# Patient Record
Sex: Female | Born: 1975
Health system: Southern US, Community
[De-identification: ages and names within clinical notes are randomized; demographics above are authoritative.]

## PROBLEM LIST (undated history)

## (undated) DIAGNOSIS — D649 Anemia, unspecified: Secondary | ICD-10-CM

## (undated) DIAGNOSIS — M791 Myalgia, unspecified site: Secondary | ICD-10-CM

## (undated) DIAGNOSIS — E278 Other specified disorders of adrenal gland: Secondary | ICD-10-CM

## (undated) DIAGNOSIS — E669 Obesity, unspecified: Secondary | ICD-10-CM

## (undated) DIAGNOSIS — F410 Panic disorder [episodic paroxysmal anxiety] without agoraphobia: Secondary | ICD-10-CM

## (undated) DIAGNOSIS — IMO0001 Reserved for inherently not codable concepts without codable children: Principal | ICD-10-CM

## (undated) DIAGNOSIS — F329 Major depressive disorder, single episode, unspecified: Secondary | ICD-10-CM

## (undated) DIAGNOSIS — G43909 Migraine, unspecified, not intractable, without status migrainosus: Secondary | ICD-10-CM

## (undated) DIAGNOSIS — F32A Depression, unspecified: Secondary | ICD-10-CM

## (undated) DIAGNOSIS — D3502 Benign neoplasm of left adrenal gland: Secondary | ICD-10-CM

## (undated) DIAGNOSIS — I10 Essential (primary) hypertension: Secondary | ICD-10-CM

## (undated) DIAGNOSIS — M797 Fibromyalgia: Secondary | ICD-10-CM

## (undated) DIAGNOSIS — D509 Iron deficiency anemia, unspecified: Secondary | ICD-10-CM

## (undated) DIAGNOSIS — G932 Benign intracranial hypertension: Secondary | ICD-10-CM

## (undated) DIAGNOSIS — R51 Headache: Secondary | ICD-10-CM

## (undated) HISTORY — DX: Depression, unspecified: F32.A

## (undated) HISTORY — PX: CARPAL TUNNEL RELEASE: SHX101

## (undated) HISTORY — DX: Major depressive disorder, single episode, unspecified: F32.9

## (undated) HISTORY — DX: Iron deficiency anemia, unspecified: D50.9

## (undated) HISTORY — PX: TUBAL LIGATION: SHX77

## (undated) HISTORY — DX: Obesity, unspecified: E66.9

## (undated) HISTORY — DX: Headache: R51

## (undated) HISTORY — DX: Myalgia, unspecified site: M79.10

## (undated) HISTORY — DX: Anemia, unspecified: D64.9

## (undated) HISTORY — DX: Reserved for inherently not codable concepts without codable children: IMO0001

---
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
## 1999-04-28 ENCOUNTER — Emergency Department (HOSPITAL_COMMUNITY): Admission: EM | Admit: 1999-04-28 | Discharge: 1999-04-28 | Payer: Self-pay | Admitting: Emergency Medicine

## 1999-04-28 ENCOUNTER — Encounter: Payer: Self-pay | Admitting: Emergency Medicine

## 1999-05-20 ENCOUNTER — Inpatient Hospital Stay (HOSPITAL_COMMUNITY): Admission: AD | Admit: 1999-05-20 | Discharge: 1999-05-20 | Payer: Self-pay | Admitting: Obstetrics & Gynecology

## 2000-09-06 ENCOUNTER — Other Ambulatory Visit: Admission: RE | Admit: 2000-09-06 | Discharge: 2000-09-06 | Payer: Self-pay | Admitting: Obstetrics and Gynecology

## 2001-07-23 ENCOUNTER — Encounter: Payer: Self-pay | Admitting: Emergency Medicine

## 2001-07-23 ENCOUNTER — Emergency Department (HOSPITAL_COMMUNITY): Admission: EM | Admit: 2001-07-23 | Discharge: 2001-07-23 | Payer: Self-pay | Admitting: Emergency Medicine

## 2001-11-12 ENCOUNTER — Emergency Department (HOSPITAL_COMMUNITY): Admission: EM | Admit: 2001-11-12 | Discharge: 2001-11-12 | Payer: Self-pay | Admitting: *Deleted

## 2001-11-16 ENCOUNTER — Emergency Department (HOSPITAL_COMMUNITY): Admission: EM | Admit: 2001-11-16 | Discharge: 2001-11-16 | Payer: Self-pay | Admitting: Emergency Medicine

## 2001-11-16 ENCOUNTER — Encounter: Payer: Self-pay | Admitting: Emergency Medicine

## 2002-01-04 ENCOUNTER — Emergency Department (HOSPITAL_COMMUNITY): Admission: EM | Admit: 2002-01-04 | Discharge: 2002-01-04 | Payer: Self-pay | Admitting: Internal Medicine

## 2002-02-12 ENCOUNTER — Emergency Department (HOSPITAL_COMMUNITY): Admission: EM | Admit: 2002-02-12 | Discharge: 2002-02-12 | Payer: Self-pay | Admitting: Emergency Medicine

## 2002-03-21 HISTORY — PX: CARPAL TUNNEL RELEASE: SHX101

## 2002-08-26 ENCOUNTER — Emergency Department (HOSPITAL_COMMUNITY): Admission: EM | Admit: 2002-08-26 | Discharge: 2002-08-26 | Payer: Self-pay | Admitting: *Deleted

## 2002-08-26 ENCOUNTER — Encounter: Payer: Self-pay | Admitting: *Deleted

## 2003-06-04 ENCOUNTER — Emergency Department (HOSPITAL_COMMUNITY): Admission: EM | Admit: 2003-06-04 | Discharge: 2003-06-04 | Payer: Self-pay | Admitting: Emergency Medicine

## 2003-10-08 ENCOUNTER — Emergency Department (HOSPITAL_COMMUNITY): Admission: EM | Admit: 2003-10-08 | Discharge: 2003-10-08 | Payer: Self-pay | Admitting: Emergency Medicine

## 2004-01-02 ENCOUNTER — Emergency Department (HOSPITAL_COMMUNITY): Admission: EM | Admit: 2004-01-02 | Discharge: 2004-01-02 | Payer: Self-pay | Admitting: Emergency Medicine

## 2004-03-21 HISTORY — PX: TUBAL LIGATION: SHX77

## 2004-04-16 ENCOUNTER — Emergency Department (HOSPITAL_COMMUNITY): Admission: EM | Admit: 2004-04-16 | Discharge: 2004-04-16 | Payer: Self-pay | Admitting: Emergency Medicine

## 2004-06-07 ENCOUNTER — Emergency Department (HOSPITAL_COMMUNITY): Admission: EM | Admit: 2004-06-07 | Discharge: 2004-06-07 | Payer: Self-pay | Admitting: Emergency Medicine

## 2004-10-01 ENCOUNTER — Ambulatory Visit (HOSPITAL_COMMUNITY): Admission: AD | Admit: 2004-10-01 | Discharge: 2004-10-01 | Payer: Self-pay | Admitting: Obstetrics and Gynecology

## 2004-10-12 ENCOUNTER — Ambulatory Visit (HOSPITAL_COMMUNITY): Admission: RE | Admit: 2004-10-12 | Discharge: 2004-10-12 | Payer: Self-pay | Admitting: Obstetrics and Gynecology

## 2004-10-24 ENCOUNTER — Ambulatory Visit (HOSPITAL_COMMUNITY): Admission: AD | Admit: 2004-10-24 | Discharge: 2004-10-24 | Payer: Self-pay | Admitting: Obstetrics and Gynecology

## 2004-11-12 ENCOUNTER — Ambulatory Visit (HOSPITAL_COMMUNITY): Admission: RE | Admit: 2004-11-12 | Discharge: 2004-11-12 | Payer: Self-pay | Admitting: Obstetrics and Gynecology

## 2004-11-23 ENCOUNTER — Ambulatory Visit (HOSPITAL_COMMUNITY): Admission: AD | Admit: 2004-11-23 | Discharge: 2004-11-23 | Payer: Self-pay | Admitting: Obstetrics and Gynecology

## 2004-11-24 ENCOUNTER — Inpatient Hospital Stay (HOSPITAL_COMMUNITY): Admission: RE | Admit: 2004-11-24 | Discharge: 2004-11-27 | Payer: Self-pay | Admitting: Obstetrics and Gynecology

## 2004-11-24 ENCOUNTER — Encounter: Payer: Self-pay | Admitting: Obstetrics and Gynecology

## 2005-04-12 ENCOUNTER — Emergency Department (HOSPITAL_COMMUNITY): Admission: EM | Admit: 2005-04-12 | Discharge: 2005-04-12 | Payer: Self-pay | Admitting: Emergency Medicine

## 2005-08-30 ENCOUNTER — Ambulatory Visit (HOSPITAL_COMMUNITY): Admission: RE | Admit: 2005-08-30 | Discharge: 2005-08-30 | Payer: Self-pay | Admitting: Orthopaedic Surgery

## 2005-08-30 ENCOUNTER — Encounter (INDEPENDENT_AMBULATORY_CARE_PROVIDER_SITE_OTHER): Payer: Self-pay | Admitting: *Deleted

## 2006-04-24 ENCOUNTER — Emergency Department (HOSPITAL_COMMUNITY): Admission: EM | Admit: 2006-04-24 | Discharge: 2006-04-24 | Payer: Self-pay | Admitting: Emergency Medicine

## 2009-03-01 ENCOUNTER — Emergency Department (HOSPITAL_COMMUNITY): Admission: EM | Admit: 2009-03-01 | Discharge: 2009-03-01 | Payer: Self-pay | Admitting: Emergency Medicine

## 2009-06-04 ENCOUNTER — Emergency Department (HOSPITAL_COMMUNITY): Admission: EM | Admit: 2009-06-04 | Discharge: 2009-06-04 | Payer: Self-pay | Admitting: Emergency Medicine

## 2009-09-30 ENCOUNTER — Emergency Department (HOSPITAL_COMMUNITY): Admission: EM | Admit: 2009-09-30 | Discharge: 2009-09-30 | Payer: Self-pay | Admitting: Emergency Medicine

## 2009-11-26 ENCOUNTER — Emergency Department (HOSPITAL_COMMUNITY): Admission: EM | Admit: 2009-11-26 | Discharge: 2009-11-26 | Payer: Self-pay | Admitting: Family Medicine

## 2010-04-09 ENCOUNTER — Emergency Department (HOSPITAL_COMMUNITY)
Admission: EM | Admit: 2010-04-09 | Discharge: 2010-04-09 | Payer: Self-pay | Source: Home / Self Care | Admitting: Emergency Medicine

## 2010-06-22 LAB — RAPID STREP SCREEN (MED CTR MEBANE ONLY): Streptococcus, Group A Screen (Direct): NEGATIVE

## 2010-08-06 NOTE — Group Therapy Note (Signed)
Julie Schwartz, Julie Schwartz             ACCOUNT NO.:  [id number]   MEDICAL RECORD NO.:  [id number]          PATIENT TYPE:  OIB   LOCATION:  A415                          FACILITY:  [facility]   PHYSICIAN:  Tilda Burrow, M.D. DATE OF BIRTH:  December 13, 1975   DATE OF PROCEDURE:  DATE OF DISCHARGE:                                   PROGRESS NOTE   Briellah is 36-5/[redacted] weeks gestation. Planned repeat Cesarean section. Called  the office complaining of some nausea and cramping. We sent her over here to  rule out labor. She was having a few mild contractions. Her cervix was long,  thick, and closed. Her nausea subsided. The fetal heart rate is reactive, so  she was sent home in stable condition after about two hours of observation.      Jacklyn Shell, C.N.M.      Tilda Burrow, M.D.  Electronically Signed    FC/MEDQ  D:  11/12/2004  T:  11/12/2004  Job:  [id number]

## 2010-08-06 NOTE — Group Therapy Note (Signed)
NAMENAKKIA, MACKIEWICZ             ACCOUNT NO.:  [id number]   MEDICAL RECORD NO.:  [id number]          PATIENT TYPE:  OIB   LOCATION:  LDR1                          FACILITY:  [facility]   PHYSICIAN:  Lazaro Arms, M.D.   DATE OF BIRTH:  [birth year]   DATE OF PROCEDURE:  11/23/2004  DATE OF DISCHARGE:  11/23/2004                                   PROGRESS NOTE   HISTORY OF PRESENT ILLNESS:  Julie Schwartz was down in registration doing her preop  for her scheduled repeat cesarean section tomorrow afternoon. Started having  a little shortness of breath, hunger pains, and some tightening of her  stomach. She was evaluated. Noted to be having an occasional mild  contraction. O2 saturations were 98%. She felt better after sitting down and  resting for a while. Feels like she probably just got too hot and a little  hungry. Her cervix is tight, fingertip, really log. Vertex is minus 2 to  minus 3.   DISPOSITION:  So she was sent home in stable condition and will return as  scheduled tomorrow for her cesarean section.      Jacklyn Shell, C.N.M.      Lazaro Arms, M.D.  Electronically Signed    FC/MEDQ  D:  11/23/2004  T:  11/23/2004  Job:  [id number]   cc:   Jacklyn Shell, C.N.M.  [address]  [place], [place] [address]  Fax: [phone]

## 2010-08-06 NOTE — H&P (Signed)
NAMETREANNA, Julie Schwartz NO.:  [id number]   MEDICAL RECORD NO.:  [id number]           PATIENT TYPE:   LOCATION:                                FACILITY:  [facility]   PHYSICIAN:  Tilda Burrow, M.D.      DATE OF BIRTH:   DATE OF ADMISSION:  11/24/2004  DATE OF DISCHARGE:  LH                                HISTORY & PHYSICAL   ADMISSION DIAGNOSES:  1.  Pregnancy 38-1/[redacted] weeks gestation.  2.  Repair cesarean section.  Offered trial of labor.  3.  Desire for elective sterilization.  4.  Chronic hypertension, well controlled.   HISTORY OF PRESENT ILLNESS:  This 35 year old female gravida 2, para 1, AB  0, LMP February 02, 2004, placing menstrual EDC at November 08, 2004 with  ultrasound correction of [facility] of December 05, 2004 based on six-week  ultrasound and December 01, 2004 based on 22-week ultrasound, is admitted  after a pregnancy course followed through out office. She will be  approximately 38-1/[redacted] weeks gestation by ultrasound criteria on the date of  scheduled cesarean section, November 24, 2004 at 12:30 p.m.   Prenatal course has been notable through the office for well-controlled  blood pressure through 20 prenatal visits. She has been on Aldomet 500 mg  twice a day which was cut down to 250 mg q.a.m., 250 q.p.m. and 500 mg at  night with continued dizziness.  She was switched to Norvasc 30 mg p.o.  daily and has done beautifully with blood pressure in the 112-140/60-80  range.  Fundal height growth has been appropriate.  Her first cesarean  section was in 2001 for cephalopelvic disproportion after a pregnancy  complicated by pregnancy-induced hypertension.   PAST MEDICAL HISTORY:  1.  Pregnancy-induced hypertension with first pregnancy.  2.  Cesarean section, low transverse in 2001.   ALLERGIES:  None.   SOCIAL HISTORY:  Single. Lives with mother.  Has no desire for future child-  bearing.  Understands the technical aspects of sterilization and with  failure rate of 1 in 100 quoted the patient.   PRENATAL LABS:  Blood type A positive.  Urine drug screen negative.  Hemoglobin 11, hematocrit 34, rubella immune.  present__.  Hepatitis, HIV,  RPR, GC and Chlamydia all negative.  MSAFP negative at 1 in 650 [place]  syndrome risk.  Glucose tolerance test 90 mg/% at 28 weeks.  This is the  first baby of her current partner, Celestia Khat.   PLAN:  Repeat cesarean section and tubal ligation Wednesday, September 6 at  12:30 p.m.      Tilda Burrow, M.D.  Electronically Signed     JVF/MEDQ  D:  11/17/2004  T:  11/17/2004  Job:  [id number]   cc:   Mila Homer. Sudie Bailey, M.D.  [address] [place], [place] [address]  Fax: [phone]   Francoise Schaumann. Halford Chessman  Fax: [phone]

## 2010-08-06 NOTE — Op Note (Signed)
[name], Julie Schwartz             ACCOUNT NO.:  [id number]   MEDICAL RECORD NO.:  [id number]          PATIENT TYPE:  INP   LOCATION:  A402                          FACILITY:  [facility]   PHYSICIAN:  Tilda Burrow, M.D. DATE OF BIRTH:  [birth year]   DATE OF PROCEDURE:  11/24/2004  DATE OF DISCHARGE:                                 OPERATIVE REPORT   PREOPERATIVE DIAGNOSES:  1.  Pregnancy, 38-1/[redacted] weeks gestation, prior Cesarean section, not for trial      of labor.  2.  Elective permanent sterilization.  3.  Chronic hypertension, stable.   POSTOPERATIVE DIAGNOSES:  1.  Pregnancy, 38-1/[redacted] weeks gestation, prior Cesarean section, not for trial      of labor.  2.  Elective permanent sterilization.  3.  Chronic hypertension, stable.   PROCEDURE:  Repeat low transverse cervical Cesarean section. Bilateral  partial salpingectomy, excision of cicatrix (__NC___).   SURGEON:  Tilda Burrow, M.D.   ASSISTANTGeralynn Ochs, C.S.T.-F.A.   ANESTHESIA:  Spinal, Idacavage, C.R.N.A.   COMPLICATIONS:  None.   FINDINGS:  Healthy female infant. Apgars 9 and 9. Weight __________.   INDICATIONS:  A 35 year old female followed through the pregnancy with a  known prior low transverse cervical Cesarean section.   DETAILS OF PROCEDURE:  Spinal anesthesia was introduced by Mr. Idacavage,  C.R.N.A. Abdomen prepped and draped, and the transverse lower abdominal  incision performed in a standard fashion. The cicatrix was discarded. The  peritoneal cavity was entered in standard Pfannenstiel technique, and some  small amount of omental adhesions to the bladder flap area were taken down.  We then proceeded with development of bladder flap on the lower uterine  segment and transverse lower uterine segment incision made with knife down  to the amniotic sac, extended laterally using index finger traction. Fetal  vertex was delivered using vacuum extractor guidance combined with fundal  pressure. The umbilical  cord was found to be extremely short, less than 30  cm total length. The cord was clamped, cut, and the infant was passed to the  waiting Dr. Francoise Schaumann. Halm, D.O.   Cord blood samples were then obtained. Placenta delivered by Crede uterine  massage and membranes apparently intact. The uterus was found to be  hemostatic and closed using single layer of running locking closure of 2-0  chromic with some antibiotic-containing solution instilled into the uterus.  Bladder flap was reapproximated with interrupted 2-0 chromic and then the  abdomen emptied of some residual clots. Peritoneum was irrigated so that the  clots were completely removed. We then proceeded with tubal ligation.   Tubal ligation consisted of doubly ligating around the mid segment knuckle  of each tube, excising the incarcerated segment of tube and sending these  portions of resected tube for histologic confirmation.   The anterior peritoneum was closed with continuous running 2-0 chromic, the  fascia closed with continuous running 0 Vicryl. The subcutaneous fatty  tissues trimmed so that they would leave a more satisfactory cosmetic result  and then pulled together using interrupted 2-0 plain sutures x2. Staple  closure of the skin  followed with patient tolerating the whole procedure  well and going to the recovery room in stable condition with sponge and  needle counts correction.      Tilda Burrow, M.D.  Electronically Signed     JVF/MEDQ  D:  11/24/2004  T:  11/24/2004  Job:  [id number]   cc:   Francoise Schaumann. Halford Chessman  Fax: [phone]

## 2010-08-06 NOTE — Consult Note (Signed)
Julie Schwartz, Julie Schwartz             ACCOUNT NO.:  [id number]   MEDICAL RECORD NO.:  [id number]          PATIENT TYPE:  OIB   LOCATION:  LDR3                          FACILITY:  [facility]   PHYSICIAN:  Tilda Burrow, M.D. DATE OF BIRTH:  01/13/1976   DATE OF CONSULTATION:  10/12/2004  DATE OF DISCHARGE:                                   CONSULTATION   REASON FOR ADMISSION:  Pregnancy at 32 weeks with preterm uterine  contractions.   HISTORY OF PRESENT ILLNESS:  Julie Schwartz was up most of the evening with preterm  contractions.  When they got more uncomfortable, she proceeded to the  hospital.  Upon arrival to the hospital, they were about 2-4 minutes apart  and very mild.  Cervix was a fingertip deep, posterior and high.  With exam  this morning, exam is the same.  There are no uterine contractions at  present time.  After hydration and _tocolysis with___ terbutaline.  Labs  were within normal limits.   PLAN:  We are going to discharge her home.  She will follow up with me in  the morning in the office for fetal fibronectin.  She knows to call with any  problems.       DL/MEDQ  D:  16/12/9602  T:  10/12/2004  Job:  [id number]

## 2010-08-06 NOTE — Consult Note (Signed)
NAMENIEVES, CHAPA NO.:  [id number]   MEDICAL RECORD NO.:  [id number]          PATIENT TYPE:  EMS   LOCATION:  ED                            FACILITY:  [facility]   PHYSICIAN:  Tilda Burrow, M.D. DATE OF BIRTH:  09/17/1975   DATE OF CONSULTATION:  04/16/2004  DATE OF DISCHARGE:                                   CONSULTATION   CHIEF COMPLAINT:  Right lower quadrant pain, early pregnancy.   HISTORY OF PRESENT ILLNESS:  The patient is a 35 year old female, gravida 1,  para 1 seen in the emergency department for evaluation of acute onset of  right lower quadrant pain beginning approximately 4 o'clock a.m. on April 16, 2004, presenting to the emergency room at 6 o'clock a.m. with right  lower quadrant pain, no nausea or vomiting.  The pain began spontaneously,  awoke the patient.  This is not associated with bleeding.  The patient has  noticed slight increase in vaginal secretions only.  The pregnancy was  confirmed in our office with positive pregnancy test last week and  ultrasound suggested fetal heart motion on a very early ultrasound.  The  patient has been evaluated with normal white count of 6000, is afebrile with  patient undergoing vaginal ultrasound.   PHYSICAL EXAMINATION:  GENERAL:  A generally healthy female in minimal  discomfort at the time of evaluation.   Ultrasound scan is observed which shows a normal cervix, anteflexed uterus,  intrauterine gestational sac with yolk sac visible and tiny 2 to 3 mm fetal  pole with fetal heart motion noted, consistent with intrauterine pregnancy  at six to seven weeks gestation.  Adnexa showed a right adnexal ovarian cyst  measuring 2.7 cm maximum diameter, it is a simple cyst.  The patient is not  acutely tender when vaginal probing is in the area.  Obviously, the pain has  diminished from presentation.  There is not significant adnexal fluid.   IMPRESSION:  Corpus luteum cyst in early pregnancy.  No evidence  of adnexal  torsion.   PLAN:  Tylenol #3 for pain, routine follow-up in our office.  Signs and  symptoms of adnexal torsion reviewed with the patient.      JVF/MEDQ  D:  04/16/2004  T:  04/16/2004  Job:  [id number]

## 2010-08-06 NOTE — Discharge Summary (Signed)
Julie Schwartz, Julie Schwartz             ACCOUNT NO.:  [id number]   MEDICAL RECORD NO.:  [id number]          PATIENT TYPE:  INP   LOCATION:  A402                          FACILITY:  [facility]   PHYSICIAN:  Tilda Burrow, M.D. DATE OF BIRTH:  [birth year]   DATE OF ADMISSION:  11/24/2004  DATE OF DISCHARGE:  09/09/2006LH                                 DISCHARGE SUMMARY   ADMISSION DIAGNOSES:  1.  Pregnancy 38-1/[redacted] weeks gestation, repeat cesarean section, not for trial      of labor.  2.  Desire for elective sterilization.  3.  Chronic hypertension well controlled.   DISCHARGE DIAGNOSES:  1.  Pregnancy 38-1/[redacted] weeks gestation, repeat cesarean section, not for trial      of labor.  2.  Desire for elective sterilization.  3.  Chronic hypertension well controlled.   PROCEDURES:  Repeat low transverse cervical cesarean section, bilateral  partial salpingectomy, excision of cicatrix ([place]) performed November 24, 2004.   DISCHARGE MEDICATIONS:  Tylox 1 p.o. q.4 h p.r.n. pain.   HOSPITAL COURSE:  The patient was admitted third day surgery and underwent  repeat low transverse cervical cesarean section, bilateral partial  salpingectomy with excision of a large cicatrix as described in the  operative note of November 24, 2004.  The umbilical cord was extremely  short, less than 30 cm total length, but the baby was not normal in  appearance with three vessel cord confirmed.  EBL at surgery was within  normal limits.  Estimated at 600 cc.  A portion of each tube was removed at  surgery confirming permanent tubal sterilization.  The patient then had  postoperative course that was uneventful.  She had routine postpartum care  x2 days, stable for discharge, to hotel status on postoperative day #3 due  to the baby feeding poorly and having tachypnea.  This required an  additional day of hospital status.  She was converted to hotel status on  September 9, and stayed in that for 24 hours after discharge  from the  hospital.      Tilda Burrow, M.D.  Electronically Signed     JVF/MEDQ  D:  12/09/2004  T:  12/10/2004  Job:  [id number]

## 2010-08-06 NOTE — Discharge Summary (Signed)
NAMEZOEE, HEENEY             ACCOUNT NO.:  [id number]   MEDICAL RECORD NO.:  [id number]          PATIENT TYPE:  OIB   LOCATION:  LDR1                          FACILITY:  [facility]   PHYSICIAN:  Tilda Burrow, M.D. DATE OF BIRTH:  Nov 15, 1975   DATE OF ADMISSION:  10/01/2004  DATE OF DISCHARGE:  07/14/2006LH                                 DISCHARGE SUMMARY   CHIEF COMPLAINT:  Shooting vaginal pain x2 days.   OBSERVATION COURSE:  This is a 35 year old female at 71 weeks' gestation who  presents with sharp, shooting pains different from prior pregnancy.  No  bleeding noted or gush of fluid and no leakage.  No recent sexual  intercourse.  Pain has been going on x2 days.  External monitoring shows the  fetus to be healthy and reactive with no decelerations.   PHYSICAL EXAMINATION:  ABDOMEN:  Abdomen is nontender.  Uterus is  appropriate size for 30 weeks' gestation.  PELVIC:  Cervical exam with pelvis well-supported.  The uterus with internal  os closed and cervix quite long with no bleeding.   IMPRESSION:  Musculoskeletal  of pregnancy (lightening), pregnancy 30 weeks.   PLAN:  1.  Analgesics encouraged.  2.  Light activity x48 hours.  3.  Prescription for Tylox, 30 tablets, one q.6h. p.r.n. pain.   FOLLOW UP:  Follow up as scheduled in one week or earlier if change in  status.       JVF/MEDQ  D:  10/01/2004  T:  10/01/2004  Job:  [id number]

## 2010-08-06 NOTE — Op Note (Signed)
NAMEJEMIAH, Julie Schwartz             ACCOUNT NO.:  [id number]   MEDICAL RECORD NO.:  [id number]          PATIENT TYPE:  AMB   LOCATION:  DAY                           FACILITY:  [facility]   PHYSICIAN:  J. Darreld Mclean, M.D. DATE OF BIRTH:  09-28-1975   DATE OF PROCEDURE:  08/30/2005  DATE OF DISCHARGE:                                 OPERATIVE REPORT   PREOPERATIVE DIAGNOSIS:  Carpal tunnel syndrome, right.   POSTOPERATIVE DIAGNOSIS:  Carpal tunnel syndrome, right.   PROCEDURE:  Open release of volar carpal ligaments, saline neurolysis,  epineurotomy the median nerve.   ANESTHESIA:  Bier block. Please refer to anesthesia for Bier block  tourniquet time.   No drains used, volar plaster splint applied.   SURGEON:  J. Darreld Mclean, M.D.   INDICATIONS FOR PROCEDURE:  The patient is a 35 year old female with  positive EMGs and neuroconduction studies showing carpal tunnel syndrome on  the right. She has not improved by conservative treatment. Surgery is  recommended. The risks and imponderables were discussed with the patient  preoperatively. She appears to understand and agreed with the procedure as  outlined.   DESCRIPTION OF PROCEDURE:  She was seen in the holding area and the right  hand was identified as the correct surgical site. I placed a mark on the  right wrist. She was brought to the operating room. She was placed supine  and given Bier block anesthesia to her right upper extremity. Had a timeout  re-identifying the patient, doing the right arm and right wrist for carpal  tunnel syndrome. Prepped and draped in the usual manner, __________ incision  was made with careful dissection. The median nerve was identified  proximally, a Vesseloop placed around the nerve. A groove director placed  within the carpal tunnel space. The volar carpal ligament was then incised.  A specimen of volar carpal ligament was sent to pathology. Retinaculum cut  proximally. A saline neurolysis and  separate neurotomy carried out of the  nerve. No apparent injury of the nerve was present. The wound was then  reapproximated using 3-0 nylon in interrupted vertical mattress manner. A  sterile dressing applied, bulky dressing applied, sheet cotton applied.  Sheet cotton cut dorsally. Volar plaster splint applied, each bandage  applied loosely. The patient tolerated the procedure well and went to  recovery in good condition. A prescription of Vicodin ES given for pain. I  will see him in the office in approximately 10 days to 2 weeks. If any  difficulties, she is to contact me through the office or hospital beeper  system, numbers have been provided.          ______________________________  [name], M.D.    JWK/MEDQ  D:  08/30/2005  T:  08/30/2005  Job:  [id number]

## 2010-08-21 ENCOUNTER — Inpatient Hospital Stay (INDEPENDENT_AMBULATORY_CARE_PROVIDER_SITE_OTHER)
Admission: RE | Admit: 2010-08-21 | Discharge: 2010-08-21 | Disposition: A | Discharge status: Home / Self Care | Payer: 59 | Source: Ambulatory Visit | Attending: Family Medicine | Admitting: Family Medicine

## 2010-08-21 DIAGNOSIS — T22029A Burn of unspecified degree of unspecified elbow, initial encounter: Secondary | ICD-10-CM

## 2010-09-23 ENCOUNTER — Emergency Department (HOSPITAL_COMMUNITY)
Admission: EM | Admit: 2010-09-23 | Discharge: 2010-09-23 | Disposition: A | Discharge status: Home / Self Care | Payer: 59 | Attending: Emergency Medicine | Admitting: Emergency Medicine

## 2010-09-23 DIAGNOSIS — I10 Essential (primary) hypertension: Secondary | ICD-10-CM | POA: Insufficient documentation

## 2010-09-23 DIAGNOSIS — K089 Disorder of teeth and supporting structures, unspecified: Secondary | ICD-10-CM | POA: Insufficient documentation

## 2011-01-23 ENCOUNTER — Emergency Department (HOSPITAL_COMMUNITY)
Admission: EM | Admit: 2011-01-23 | Discharge: 2011-01-23 | Disposition: A | Discharge status: Home / Self Care | Payer: 59 | Attending: Emergency Medicine | Admitting: Emergency Medicine

## 2011-01-23 ENCOUNTER — Encounter: Payer: Self-pay | Admitting: Emergency Medicine

## 2011-01-23 DIAGNOSIS — K047 Periapical abscess without sinus: Secondary | ICD-10-CM | POA: Insufficient documentation

## 2011-01-23 DIAGNOSIS — K0381 Cracked tooth: Secondary | ICD-10-CM | POA: Insufficient documentation

## 2011-01-23 MED ORDER — IBUPROFEN 800 MG PO TABS: 800.0000 mg | ORAL_TABLET | Freq: Three times a day (TID) | ORAL | Status: AC | PRN

## 2011-01-23 MED ORDER — HYDROCODONE-ACETAMINOPHEN 5-325 MG PO TABS: 1.0000 | ORAL_TABLET | ORAL | Status: AC | PRN

## 2011-01-23 MED ORDER — AMOXICILLIN 250 MG PO CAPS
500.0000 mg | ORAL_CAPSULE | Freq: Once | ORAL | Status: AC
  Administered 2011-01-23: 500 mg via ORAL
  Filled 2011-01-23: qty 2

## 2011-01-23 MED ORDER — AMOXICILLIN 500 MG PO CAPS: 500.0000 mg | ORAL_CAPSULE | Freq: Three times a day (TID) | ORAL | Status: AC

## 2011-01-23 MED ORDER — IBUPROFEN 800 MG PO TABS
800.0000 mg | ORAL_TABLET | Freq: Once | ORAL | Status: AC
  Administered 2011-01-23: 800 mg via ORAL
  Filled 2011-01-23: qty 1

## 2011-01-23 NOTE — ED Notes (Addendum)
Patient c/o upper, right sided dental pain that started yesterday. Swelling noted to right side of jaw

## 2011-01-23 NOTE — ED Provider Notes (Signed)
Medical screening examination/treatment/procedure(s) were performed by non-physician practitioner and as supervising physician I was immediately available for consultation/collaboration.   Benny Lennert, MD 01/23/11 1504

## 2011-01-23 NOTE — ED Provider Notes (Signed)
History     CSN: [id number] Arrival date & time: 01/23/2011  8:04 AM   First MD Initiated Contact with Patient 01/23/11 [phone]      Chief Complaint  Patient presents with  . Dental Pain    (Consider location/radiation/quality/duration/timing/severity/associated sxs/prior treatment) Patient is a 35 y.o. female presenting with tooth pain. The history is provided by the patient.  Dental PainThe primary symptoms include mouth pain and dental injury. Primary symptoms do not include headaches, fever, shortness of breath or sore throat. The symptoms began 12 to 24 hours ago. The symptoms are worsening (She took tylenol with no relief of symptoms.  She has found no alleviators.). The symptoms occur constantly.  Mouth pain occurs constantly. Mouth pain is worsening. Affected locations include: teeth and gum(s). At its highest the mouth pain was at 8/10. The mouth pain is currently at 8/10.  Additional symptoms include: dental sensitivity to temperature, gum swelling, gum tenderness, jaw pain and facial swelling. Additional symptoms do not include: trouble swallowing and swollen glands.    History reviewed. No pertinent past medical history.  Past Surgical History  Procedure Date  . Cesarean section     No family history on file.  History  Substance Use Topics  . Smoking status: Never Smoker   . Smokeless tobacco: Not on file  . Alcohol Use: Yes     occasionally    OB History    Grav Para Term Preterm Abortions TAB SAB Ect Mult Living                  Review of Systems  Constitutional: Negative for fever.  HENT: Positive for facial swelling and dental problem. Negative for congestion, sore throat, trouble swallowing and neck pain.   Eyes: Negative.   Respiratory: Negative for chest tightness and shortness of breath.   Cardiovascular: Negative for chest pain.  Gastrointestinal: Negative for nausea and abdominal pain.  Genitourinary: Negative.   Musculoskeletal: Negative for  joint swelling and arthralgias.  Skin: Negative.  Negative for rash and wound.  Neurological: Negative for dizziness, weakness, light-headedness, numbness and headaches.  Hematological: Negative.   Psychiatric/Behavioral: Negative.     Allergies  Review of patient's allergies indicates no known allergies.  Home Medications  No current outpatient prescriptions on file.  BP 133/79  Pulse 72  Temp(Src) 98.2 F (36.8 C) (Oral)  Resp 15  Ht 5\' 9"  (1.753 m)  Wt 186 lb (84.369 kg)  BMI 27.47 kg/m2  SpO2 96%  LMP 12/31/2010  Physical Exam  Constitutional: She is oriented to person, place, and time. She appears well-developed and well-nourished. No distress.  HENT:  Head: Normocephalic and atraumatic.  Right Ear: Tympanic membrane and external ear normal.  Left Ear: Tympanic membrane and external ear normal.  Mouth/Throat: Oropharynx is clear and moist and mucous membranes are normal. No oral lesions. Dental abscesses present.    Eyes: Conjunctivae are normal.  Neck: Normal range of motion. Neck supple.  Cardiovascular: Normal rate and normal heart sounds.   Pulmonary/Chest: Effort normal.  Abdominal: She exhibits no distension.  Musculoskeletal: Normal range of motion.  Lymphadenopathy:    She has no cervical adenopathy.  Neurological: She is alert and oriented to person, place, and time.  Skin: Skin is warm and dry. No erythema.  Psychiatric: She has a normal mood and affect.    ED Course  Procedures (including critical care time)  Labs Reviewed - No data to display No results found.   No diagnosis found.  MDM  Dental fracture with early abscess.  Amoxil,  Hydrocodone,  Ibuprofen.  Patient has dentist in [place] she plans to call in am.       Candis Musa, [place] 01/23/11 [phone]

## 2011-03-19 ENCOUNTER — Encounter (HOSPITAL_COMMUNITY): Payer: Self-pay | Admitting: Emergency Medicine

## 2011-03-19 ENCOUNTER — Emergency Department (HOSPITAL_COMMUNITY)
Admission: EM | Admit: 2011-03-19 | Discharge: 2011-03-19 | Disposition: A | Discharge status: Home / Self Care | Payer: 59 | Attending: Emergency Medicine | Admitting: Emergency Medicine

## 2011-03-19 DIAGNOSIS — K029 Dental caries, unspecified: Secondary | ICD-10-CM

## 2011-03-19 DIAGNOSIS — K089 Disorder of teeth and supporting structures, unspecified: Secondary | ICD-10-CM | POA: Insufficient documentation

## 2011-03-19 MED ORDER — PENICILLIN V POTASSIUM 500 MG PO TABS: 500.0000 mg | ORAL_TABLET | Freq: Four times a day (QID) | ORAL | Status: AC

## 2011-03-19 MED ORDER — IBUPROFEN 600 MG PO TABS: 600.0000 mg | ORAL_TABLET | Freq: Four times a day (QID) | ORAL | Status: AC | PRN

## 2011-03-19 MED ORDER — HYDROCODONE-ACETAMINOPHEN 5-325 MG PO TABS: 2.0000 | ORAL_TABLET | ORAL | Status: AC | PRN

## 2011-03-19 NOTE — ED Provider Notes (Signed)
History     CSN: [id number]  Arrival date & time 03/19/11  0545   First MD Initiated Contact with Patient 03/19/11 [phone]      Chief Complaint  Patient presents with  . Dental Pain    (Consider location/radiation/quality/duration/timing/severity/associated sxs/prior treatment) HPI Comments: Patient reports that she began having pain 2 days ago in her right upper tooth.  She has a Education officer, community, but is not able to get in to see him at this time.  No injury to the tooth.  She reports that the pain is getting progressively worse.    Patient is a 35 y.o. female presenting with tooth pain. The history is provided by the patient.  Dental PainPrimary symptoms do not include oral bleeding, oral lesions, headaches, fever, shortness of breath or sore throat.    History reviewed. No pertinent past medical history.  Past Surgical History  Procedure Date  . Cesarean section     History reviewed. No pertinent family history.  History  Substance Use Topics  . Smoking status: Never Smoker   . Smokeless tobacco: Not on file  . Alcohol Use: Yes     occasionally    OB History    Grav Para Term Preterm Abortions TAB SAB Ect Mult Living                  Review of Systems  Constitutional: Negative for fever and chills.  HENT: Negative for sore throat, neck pain and neck stiffness.   Respiratory: Negative for shortness of breath.   Neurological: Negative for dizziness, syncope, light-headedness and headaches.    Allergies  Review of patient's allergies indicates no known allergies.  Home Medications   Current Outpatient Rx  Name Route Sig Dispense Refill  . ACETAMINOPHEN 500 MG PO TABS Oral Take 1,000 mg by mouth every 6 (six) hours as needed. For pain       BP 155/95  Pulse 100  Temp(Src) 99.1 F (37.3 C) (Oral)  Resp 18  SpO2 98%  LMP 03/07/2011  Physical Exam  Nursing note and vitals reviewed. Constitutional: She is oriented to person, place, and time. She appears  well-developed and well-nourished. No distress.  HENT:  Head: Normocephalic and atraumatic. No trismus in the jaw.  Mouth/Throat: Uvula is midline, oropharynx is clear and moist and mucous membranes are normal. No oral lesions. Dental caries present. No dental abscesses or uvula swelling.       No abscess visualized.  Dental decay of the right upper molar tooth  Neck: Normal range of motion. Neck supple.  Cardiovascular: Normal rate, regular rhythm and normal heart sounds.   Pulmonary/Chest: Effort normal and breath sounds normal. No respiratory distress.  Neurological: She is alert and oriented to person, place, and time.  Skin: Skin is warm and dry. No rash noted. She is not diaphoretic.  Psychiatric: She has a normal mood and affect.    ED Course  Procedures (including critical care time)  Labs Reviewed - No data to display No results found.   1. Dental decay       MDM  No acute injury or fracture to the tooth.  Dental decay present.  No trismus, fever, or decreased ROM of neck.  Feel that pain is most likely secondary to dental decay.  Patient given Rx for PCN and pain medication.  Patient instructed to follow up with her dentist.        Pascal Lux [facility] 03/19/11 [phone]

## 2011-03-19 NOTE — ED Notes (Signed)
PT. REPORTS PROGRESSING RIGHT UPPER MOLAR PAIN FOR 2 DAYS WITH SWELLING .

## 2011-03-20 NOTE — ED Provider Notes (Signed)
Medical screening examination/treatment/procedure(s) were performed by non-physician practitioner and as supervising physician I was immediately available for consultation/collaboration.  Jasmine Awe, MD 03/20/11 [phone]

## 2012-04-28 ENCOUNTER — Encounter (HOSPITAL_COMMUNITY): Payer: Self-pay

## 2012-04-28 DIAGNOSIS — R51 Headache: Secondary | ICD-10-CM | POA: Insufficient documentation

## 2012-04-28 DIAGNOSIS — R42 Dizziness and giddiness: Secondary | ICD-10-CM | POA: Insufficient documentation

## 2012-04-28 DIAGNOSIS — H53149 Visual discomfort, unspecified: Secondary | ICD-10-CM | POA: Insufficient documentation

## 2012-04-28 NOTE — ED Notes (Signed)
Patient presents with right sided headache that has progressively worsened over the past 3-4 days. No hx of the same. Describes pain as a "pressure" with some dizziness, photophobia and phonophobia. Denies N/V. LMP 04/23/12.

## 2012-04-29 ENCOUNTER — Emergency Department (HOSPITAL_COMMUNITY)
Admission: EM | Admit: 2012-04-29 | Discharge: 2012-04-29 | Disposition: A | Discharge status: Home / Self Care | Payer: 59 | Attending: Emergency Medicine | Admitting: Emergency Medicine

## 2012-04-29 ENCOUNTER — Emergency Department (HOSPITAL_COMMUNITY): Payer: 59

## 2012-04-29 DIAGNOSIS — R51 Headache: Secondary | ICD-10-CM

## 2012-04-29 NOTE — ED Provider Notes (Signed)
History     CSN: [id number]  Arrival date & time 04/28/12  2232   First MD Initiated Contact with Patient 04/29/12 [phone]      Chief Complaint  Patient presents with  . Headache    (Consider location/radiation/quality/duration/timing/severity/associated sxs/prior treatment) HPI Comments: GENITA NILSSON is a 37 y.o. Female who complains a headache for 3 days. The headache waxes and wanes. She has a sensation of pressure around her right eye with photophobia, and dizziness. She denies sinus congestion, or drainage. There's been no fever, chills, nausea, or vomiting. She has not tried any medications for the problem. There are no known modifying factors. She's never had a problem like this before.  Patient is a 37 y.o. female presenting with headaches. The history is provided by the patient.  Headache   History reviewed. No pertinent past medical history.  Past Surgical History  Procedure Laterality Date  . Cesarean section      No family history on file.  History  Substance Use Topics  . Smoking status: Never Smoker   . Smokeless tobacco: Never Used  . Alcohol Use: Yes     Comment: occasionally    OB History   Grav Para Term Preterm Abortions TAB SAB Ect Mult Living                  Review of Systems  Neurological: Positive for headaches.  All other systems reviewed and are negative.    Allergies  Review of patient's allergies indicates no known allergies.  Home Medications   Current Outpatient Rx  Name  Route  Sig  Dispense  Refill  . aspirin-acetaminophen-caffeine (EXCEDRIN MIGRAINE) 250-250-65 MG per tablet   Oral   Take 2 tablets by mouth every 6 (six) hours as needed for pain.           BP 102/53  Pulse 57  Temp(Src) 98.1 F (36.7 C) (Oral)  Resp 16  SpO2 100%  LMP 04/23/2012  Physical Exam  Nursing note and vitals reviewed. Constitutional: She is oriented to person, place, and time. She appears well-developed and well-nourished.  HENT:   Head: Normocephalic and atraumatic.  Eyes: Conjunctivae and EOM are normal. Pupils are equal, round, and reactive to light.  Neck: Normal range of motion and phonation normal. Neck supple.  No meningismus  Cardiovascular: Normal rate, regular rhythm and intact distal pulses.   Pulmonary/Chest: Effort normal and breath sounds normal. No stridor. She exhibits no tenderness.  Abdominal: Soft. She exhibits no distension. There is no tenderness. There is no guarding.  Musculoskeletal: Normal range of motion.  Lymphadenopathy:    She has no cervical adenopathy.  Neurological: She is alert and oriented to person, place, and time. She has normal strength. She exhibits normal muscle tone.  Skin: Skin is warm and dry.  Psychiatric: She has a normal mood and affect. Her behavior is normal. Judgment and thought content normal.    ED Course  Procedures (including critical care time)  She declined analgesia  CT ordered to rule out intracranial abnormality and evaluate for the possibility of acute sinusitis  Labs Reviewed - No data to display Ct Head Wo Contrast  04/29/2012  *RADIOLOGY REPORT*  Clinical Data: Headache, photophobia  CT HEAD WITHOUT CONTRAST  Technique:  Contiguous axial images were obtained from the base of the skull through the vertex without contrast.  Comparison: 06/04/2009  Findings: No evidence of parenchymal hemorrhage or extra-axial fluid collection. No mass lesion, mass effect, or midline shift.  No CT evidence of acute infarction.  Cerebral volume is age appropriate.  No ventriculomegaly.  The visualized paranasal sinuses are essentially clear. The mastoid air cells are unopacified.  No evidence of calvarial fracture.  IMPRESSION: No evidence of acute intracranial abnormality.   Original Report Authenticated By: Charline Bills, M.D.     Nursing Notes Reviewed/ Care Coordinated Applicable Imaging Reviewed Interpretation of Laboratory Data incorporated into ED  treatment    1. Headache       MDM  Nonspecific headache. Doubt meningitis, sinusitis, encephalitis, or migraine. Suspect tension headache. Doubt metabolic instability, serious bacterial infection or impending vascular collapse; the patient is stable for discharge.   Plan: Home Medications- OTC analgesia; Home Treatments- rest; Recommended follow up- PCP prn       Flint Melter, MD 04/29/12 [phone]

## 2012-04-29 NOTE — ED Notes (Signed)
MD at bedside. Dr. Wentz. 

## 2012-05-04 ENCOUNTER — Emergency Department (INDEPENDENT_AMBULATORY_CARE_PROVIDER_SITE_OTHER)
Admission: EM | Admit: 2012-05-04 | Discharge: 2012-05-04 | Disposition: A | Discharge status: Home / Self Care | Payer: 59 | Source: Home / Self Care | Attending: Emergency Medicine | Admitting: Emergency Medicine

## 2012-05-04 ENCOUNTER — Encounter (HOSPITAL_COMMUNITY): Payer: Self-pay | Admitting: Emergency Medicine

## 2012-05-04 DIAGNOSIS — J020 Streptococcal pharyngitis: Secondary | ICD-10-CM

## 2012-05-04 HISTORY — DX: Migraine, unspecified, not intractable, without status migrainosus: G43.909

## 2012-05-04 LAB — POCT RAPID STREP A: Streptococcus, Group A Screen (Direct): POSITIVE — AB

## 2012-05-04 MED ORDER — NAPROXEN 500 MG PO TABS: 500.0000 mg | ORAL_TABLET | Freq: Two times a day (BID) | ORAL | Status: DC

## 2012-05-04 MED ORDER — PENICILLIN G BENZATHINE 1200000 UNIT/2ML IM SUSP
INTRAMUSCULAR | Status: AC
  Filled 2012-05-04: qty 2

## 2012-05-04 MED ORDER — PREDNISONE 20 MG PO TABS: 20.0000 mg | ORAL_TABLET | Freq: Two times a day (BID) | ORAL | Status: DC

## 2012-05-04 MED ORDER — PENICILLIN G BENZATHINE 1200000 UNIT/2ML IM SUSP
1.2000 10*6.[IU] | Freq: Once | INTRAMUSCULAR | Status: AC
  Administered 2012-05-04: 1.2 10*6.[IU] via INTRAMUSCULAR

## 2012-05-04 NOTE — ED Provider Notes (Signed)
Chief Complaint  Patient presents with  . Sore Throat    History of Present Illness:   Julie Schwartz is a 37 year old female who presented a week ago at the emergency room with a headache. She has a history of migraines and this started out similar to usual migraine but then seemed to get worse. She has some photophobia with it and stated she was having a fever, although there is no documentation of a fever in the emergency room note and she did not have any elevation of her temperature at that time. A CT scan was done and was normal. She was told to take ibuprofen or Tylenol for the pain. Ever since then she's continued to have headache and has developed a number of additional symptoms including severe sore throat, nasal congestion, postnasal drip, fever of up to 102, myalgias, dizziness, and fatigue. She denies any coughing or GI symptoms. She's had no blurry vision or neurological complaints.  Review of Systems:  Other than as noted above, the patient denies any of the following symptoms. Systemic:  No fever, chills, sweats, fatigue, myalgias, headache, or anorexia. Eye:  No redness, pain or drainage. ENT:  No earache, ear congestion, nasal congestion, sneezing, rhinorrhea, sinus pressure, sinus pain, or post nasal drip. Lungs:  No cough, sputum production, wheezing, shortness of breath, or chest pain. GI:  No abdominal pain, nausea, vomiting, or diarrhea. Skin:  No rash or itching.  PMFSH:  Past medical history, family history, social history, meds, allergies, and nurse's notes were reviewed.  There is no known exposure to strep or mono.  No prior history of step or mono.  The patient denies use of tobacco.  Physical Exam:   Vital signs:  BP 150/87  Pulse 80  Temp(Src) 99.5 F (37.5 C) (Oral)  Resp 18  SpO2 98%  LMP 04/23/2012 General:  Alert, in no distress. Eye:  No conjunctival injection or drainage. Lids were normal. ENT:  TMs and canals were normal, without erythema or  inflammation.  Nasal mucosa was clear and uncongested, without drainage.  Mucous membranes were moist.  Exam of pharynx reveals swelling, erythema, and some spots of white exudate on the left tonsil.  There were no oral ulcerations or lesions. Neck:  Supple, no adenopathy, tenderness or mass. Lungs:  No respiratory distress.  Lungs were clear to auscultation, without wheezes, rales or rhonchi.  Breath sounds were clear and equal bilaterally.  Heart:  Regular rhythm, without gallops, murmers or rubs. Skin:  Clear, warm, and dry, without rash or lesions.  Labs:   Results for orders placed during the hospital encounter of 05/04/12  POCT RAPID STREP A ([facility] ONLY)      Result Value Range   Streptococcus, Group A Screen (Direct) POSITIVE (*) NEGATIVE   Course in Urgent Care Center:   She was given Bicillin LA 1.2 million units IM.  Assessment:  The encounter diagnosis was Strep throat.  Plan:   1.  The following meds were prescribed:   New Prescriptions   NAPROXEN (NAPROSYN) 500 MG TABLET    Take 1 tablet (500 mg total) by mouth 2 (two) times daily.   PREDNISONE (DELTASONE) 20 MG TABLET    Take 1 tablet (20 mg total) by mouth 2 (two) times daily.   2.  The patient was instructed in symptomatic care including hot saline gargles, throat lozenges, infectious precautions, and need to trade out toothbrush. Handouts were given. 3.  The patient was told to return if  becoming worse in any way, if no better in 3 or 4 days, and given some red flag symptoms that would indicate earlier return.    Reuben Likes, MD 05/04/12 [phone]

## 2012-05-04 NOTE — ED Notes (Signed)
C/o fever, headache and sore all over onset last Fri. 2/7.  Went to ED on 2/8 because her head was hurting really bad.  She had a CT scan and was told to f/u with neurologist.  C/o sore throat onset Tues. 2/11.  Headache not as bad but still has it.  She has had chills and gets hot, but has not noted a fever.  Taking Tylenol and Motrin everyday.

## 2012-08-24 ENCOUNTER — Emergency Department (INDEPENDENT_AMBULATORY_CARE_PROVIDER_SITE_OTHER)
Admission: EM | Admit: 2012-08-24 | Discharge: 2012-08-24 | Disposition: A | Discharge status: Home / Self Care | Payer: 59 | Source: Home / Self Care | Attending: Emergency Medicine | Admitting: Emergency Medicine

## 2012-08-24 ENCOUNTER — Encounter (HOSPITAL_COMMUNITY): Payer: Self-pay | Admitting: Emergency Medicine

## 2012-08-24 DIAGNOSIS — IMO0001 Reserved for inherently not codable concepts without codable children: Secondary | ICD-10-CM

## 2012-08-24 DIAGNOSIS — M609 Myositis, unspecified: Secondary | ICD-10-CM

## 2012-08-24 LAB — POCT URINALYSIS DIP (DEVICE)
Bilirubin Urine: NEGATIVE
Glucose, UA: NEGATIVE mg/dL
Hgb urine dipstick: NEGATIVE
Ketones, ur: NEGATIVE mg/dL
Nitrite: NEGATIVE
Protein, ur: NEGATIVE mg/dL
Specific Gravity, Urine: 1.025 (ref 1.005–1.030)
Urobilinogen, UA: 1 mg/dL (ref 0.0–1.0)
pH: 7 (ref 5.0–8.0)

## 2012-08-24 LAB — CBC WITH DIFFERENTIAL/PLATELET
Basophils Absolute: 0 10*3/uL (ref 0.0–0.1)
Basophils Relative: 0 % (ref 0–1)
Eosinophils Absolute: 0.1 10*3/uL (ref 0.0–0.7)
Eosinophils Relative: 1 % (ref 0–5)
HCT: 36.6 % (ref 36.0–46.0)
Hemoglobin: 11.9 g/dL — ABNORMAL LOW (ref 12.0–15.0)
Lymphocytes Relative: 57 % — ABNORMAL HIGH (ref 12–46)
Lymphs Abs: 2.9 10*3/uL (ref 0.7–4.0)
MCH: 30.1 pg (ref 26.0–34.0)
MCHC: 32.5 g/dL (ref 30.0–36.0)
MCV: 92.7 fL (ref 78.0–100.0)
Monocytes Absolute: 0.3 10*3/uL (ref 0.1–1.0)
Monocytes Relative: 6 % (ref 3–12)
Neutro Abs: 1.8 10*3/uL (ref 1.7–7.7)
Neutrophils Relative %: 36 % — ABNORMAL LOW (ref 43–77)
Platelets: 260 10*3/uL (ref 150–400)
RBC: 3.95 MIL/uL (ref 3.87–5.11)
RDW: 13.1 % (ref 11.5–15.5)
WBC: 5.1 10*3/uL (ref 4.0–10.5)

## 2012-08-24 LAB — POCT I-STAT, CHEM 8
BUN: 15 mg/dL (ref 6–23)
Calcium, Ion: 1.26 mmol/L — ABNORMAL HIGH (ref 1.12–1.23)
Chloride: 106 mEq/L (ref 96–112)
Creatinine, Ser: 1.1 mg/dL (ref 0.50–1.10)
Glucose, Bld: 100 mg/dL — ABNORMAL HIGH (ref 70–99)
HCT: 39 % (ref 36.0–46.0)
Hemoglobin: 13.3 g/dL (ref 12.0–15.0)
Potassium: 4.3 mEq/L (ref 3.5–5.1)
Sodium: 140 mEq/L (ref 135–145)
TCO2: 29 mmol/L (ref 0–100)

## 2012-08-24 LAB — SEDIMENTATION RATE: Sed Rate: 22 mm/hr (ref 0–22)

## 2012-08-24 LAB — TSH: TSH: 0.741 u[IU]/mL (ref 0.350–4.500)

## 2012-08-24 LAB — CK: Total CK: 276 U/L — ABNORMAL HIGH (ref 7–177)

## 2012-08-24 LAB — POCT PREGNANCY, URINE: Preg Test, Ur: NEGATIVE

## 2012-08-24 MED ORDER — HYDROCODONE-ACETAMINOPHEN 5-325 MG PO TABS: ORAL_TABLET | ORAL | Status: DC

## 2012-08-24 MED ORDER — PREDNISONE 20 MG PO TABS: 20.0000 mg | ORAL_TABLET | Freq: Two times a day (BID) | ORAL | Status: DC

## 2012-08-24 NOTE — ED Provider Notes (Signed)
Chief Complaint:   Chief Complaint  Patient presents with  . Dizziness    History of Present Illness:   Julie Schwartz is a 37 year old female who for the past week has felt sore and achy all over, she's had numbness and tingling in her arms and legs, tension her muscles in her neck, generalized weakness, headache, she feels her she's about to pass out, and has felt nauseated. She denies any fever, chills, stiff neck, URI symptoms, sore throat, swollen glands, chest pain, shortness of breath, palpitations, syncope, abdominal pain, nausea, vomiting, diarrhea, urinary or GYN complaints, extremities swelling, or skin rash.  Review of Systems:  Other than noted above, the patient denies any of the following symptoms. Systemic:  No fever, chills, sweats, fatigue, myalgias, headache, or anorexia. Eye:  No redness, pain or drainage. ENT:  No earache, nasal congestion, rhinorrhea, sinus pressure, or sore throat. Lungs:  No cough, sputum production, wheezing, shortness of breath.  Cardiovascular:  No chest pain, palpitations, or syncope. GI:  No nausea, vomiting, abdominal pain or diarrhea. GU:  No dysuria, frequency, or hematuria. Skin:  No rash or pruritis.  PMFSH:  Past medical history, family history, social history, meds, and allergies were reviewed.   Physical Exam:   Vital signs:  BP 147/94  Pulse 89  Resp 20  SpO2 98%  LMP 08/10/2012 General:  Alert, in no distress. Eye:  PERRL, full EOMs.  Lids and conjunctivas were normal. ENT:  TMs and canals were normal, without erythema or inflammation.  Nasal mucosa was clear and uncongested, without drainage.  Mucous membranes were moist.  Pharynx was clear, without exudate or drainage.  There were no oral ulcerations or lesions. Neck:  Supple, no adenopathy, tenderness or mass. Thyroid was normal. Lungs:  No respiratory distress.  Lungs were clear to auscultation, without wheezes, rales or rhonchi.  Breath sounds were clear and equal  bilaterally. Heart:  Regular rhythm, without gallops, murmers or rubs. Abdomen:  Soft, flat, and non-tender to palpation.  No hepatosplenomagaly or mass. Extremities: There is no edema, pulses are full. She has generalized muscle tenderness to palpation. There is no joint swelling and no joint pain to palpation. Most of the tenderness appears to be in the muscle areas. Neurological examination: Alert and oriented x3. Speech is clear and appropriate. Cranial nerves are intact. No muscle weakness or tremor, DTRs are normal, sensation is intact. Skin:  Clear, warm, and dry, without rash or lesions.  Labs:   Results for orders placed during the hospital encounter of 08/24/12  CBC WITH DIFFERENTIAL      Result Value Range   WBC 5.1  4.0 - 10.5 K/uL   RBC 3.95  3.87 - 5.11 MIL/uL   Hemoglobin 11.9 (*) 12.0 - 15.0 g/dL   HCT 57.8  46.9 - 62.9 %   MCV 92.7  78.0 - 100.0 fL   MCH 30.1  26.0 - 34.0 pg   MCHC 32.5  30.0 - 36.0 g/dL   RDW 52.8  41.3 - 24.4 %   Platelets 260  150 - 400 K/uL   Neutrophils Relative % 36 (*) 43 - 77 %   Neutro Abs 1.8  1.7 - 7.7 K/uL   Lymphocytes Relative 57 (*) 12 - 46 %   Lymphs Abs 2.9  0.7 - 4.0 K/uL   Monocytes Relative 6  3 - 12 %   Monocytes Absolute 0.3  0.1 - 1.0 K/uL   Eosinophils Relative 1  0 - 5 %  Eosinophils Absolute 0.1  0.0 - 0.7 K/uL   Basophils Relative 0  0 - 1 %   Basophils Absolute 0.0  0.0 - 0.1 K/uL  CK      Result Value Range   Total CK 276 (*) 7 - 177 U/L  SEDIMENTATION RATE      Result Value Range   Sed Rate 22  0 - 22 mm/hr  POCT URINALYSIS DIP (DEVICE)      Result Value Range   Glucose, UA NEGATIVE  NEGATIVE mg/dL   Bilirubin Urine NEGATIVE  NEGATIVE   Ketones, ur NEGATIVE  NEGATIVE mg/dL   Specific Gravity, Urine 1.025  1.005 - 1.030   Hgb urine dipstick NEGATIVE  NEGATIVE   pH 7.0  5.0 - 8.0   Protein, ur NEGATIVE  NEGATIVE mg/dL   Urobilinogen, UA 1.0  0.0 - 1.0 mg/dL   Nitrite NEGATIVE  NEGATIVE   Leukocytes, UA TRACE  (*) NEGATIVE  POCT PREGNANCY, URINE      Result Value Range   Preg Test, Ur NEGATIVE  NEGATIVE  POCT I-STAT, CHEM 8      Result Value Range   Sodium 140  135 - 145 mEq/L   Potassium 4.3  3.5 - 5.1 mEq/L   Chloride 106  96 - 112 mEq/L   BUN 15  6 - 23 mg/dL   Creatinine, Ser 0.98  0.50 - 1.10 mg/dL   Glucose, Bld 119 (*) 70 - 99 mg/dL   Calcium, Ion 1.47 (*) 1.12 - 1.23 mmol/L   TCO2 29  0 - 100 mmol/L   Hemoglobin 13.3  12.0 - 15.0 g/dL   HCT 82.9  56.2 - 13.0 %    Assessment:  The encounter diagnosis was Myositis.  Myositis of uncertain cause. This could be viral, polymyositis, or dermatomyositis. She'll need further evaluation by a rheumatologist. We'll start on prednisone at 40 mg per day.  Plan:   1.  The following meds were prescribed:   Discharge Medication List as of 08/24/2012  5:34 PM    START taking these medications   Details  HYDROcodone-acetaminophen (NORCO/VICODIN) 5-325 MG per tablet 1 to 2 tabs every 4 to 6 hours as needed for pain., Print    !! predniSONE (DELTASONE) 20 MG tablet Take 1 tablet (20 mg total) by mouth 2 (two) times daily., Starting 08/24/2012, Until Discontinued, Normal     !! - Potential duplicate medications found. Please discuss with provider.     2.  The patient was instructed in symptomatic care and handouts were given. 3.  The patient was told to return if becoming worse in any way, if no better in 3 or 4 days, and given some red flag symptoms such as muscle weakness or fever that would indicate earlier return. 4.  Follow up with Dr. Vicki Mallet as soon as possible.       Reuben Likes, MD 08/24/12 [id number]

## 2012-08-24 NOTE — ED Notes (Signed)
Pt c/o dizziness and headaches onset Friday Sxs also include: numbness of entire body, HA, fatigue, vomiting x2 today Denies: weakness,LOC sxs increases when she stands for long periods.  She is alert and oriented w/no signs of acute distress.

## 2012-08-26 NOTE — ED Notes (Signed)
Chart review.

## 2012-08-27 LAB — ROCKY MTN SPOTTED FVR AB, IGM-BLOOD: RMSF IgM: 0.76 IV (ref 0.00–0.89)

## 2012-11-01 ENCOUNTER — Encounter (HOSPITAL_COMMUNITY): Payer: Self-pay | Admitting: *Deleted

## 2012-11-01 ENCOUNTER — Emergency Department (HOSPITAL_COMMUNITY)
Admission: EM | Admit: 2012-11-01 | Discharge: 2012-11-01 | Disposition: A | Discharge status: Home / Self Care | Payer: 59 | Attending: Emergency Medicine | Admitting: Emergency Medicine

## 2012-11-01 DIAGNOSIS — Z79899 Other long term (current) drug therapy: Secondary | ICD-10-CM | POA: Insufficient documentation

## 2012-11-01 DIAGNOSIS — G43909 Migraine, unspecified, not intractable, without status migrainosus: Secondary | ICD-10-CM | POA: Insufficient documentation

## 2012-11-01 DIAGNOSIS — R0602 Shortness of breath: Secondary | ICD-10-CM | POA: Insufficient documentation

## 2012-11-01 DIAGNOSIS — F411 Generalized anxiety disorder: Secondary | ICD-10-CM | POA: Insufficient documentation

## 2012-11-01 DIAGNOSIS — M791 Myalgia, unspecified site: Secondary | ICD-10-CM

## 2012-11-01 DIAGNOSIS — IMO0001 Reserved for inherently not codable concepts without codable children: Secondary | ICD-10-CM | POA: Insufficient documentation

## 2012-11-01 DIAGNOSIS — M2550 Pain in unspecified joint: Secondary | ICD-10-CM | POA: Insufficient documentation

## 2012-11-01 DIAGNOSIS — M549 Dorsalgia, unspecified: Secondary | ICD-10-CM | POA: Insufficient documentation

## 2012-11-01 LAB — COMPREHENSIVE METABOLIC PANEL
ALT: 15 U/L (ref 0–35)
AST: 22 U/L (ref 0–37)
Albumin: 4 g/dL (ref 3.5–5.2)
Alkaline Phosphatase: 44 U/L (ref 39–117)
BUN: 9 mg/dL (ref 6–23)
CO2: 25 mEq/L (ref 19–32)
Calcium: 9.6 mg/dL (ref 8.4–10.5)
Chloride: 104 mEq/L (ref 96–112)
Creatinine, Ser: 1.05 mg/dL (ref 0.50–1.10)
GFR calc Af Amer: 79 mL/min — ABNORMAL LOW (ref >90)
GFR calc non Af Amer: 68 mL/min — ABNORMAL LOW (ref >90)
Glucose, Bld: 107 mg/dL — ABNORMAL HIGH (ref 70–99)
Potassium: 3.5 mEq/L (ref 3.5–5.1)
Sodium: 140 mEq/L (ref 135–145)
Total Bilirubin: 0.3 mg/dL (ref 0.3–1.2)
Total Protein: 7.3 g/dL (ref 6.0–8.3)

## 2012-11-01 LAB — CBC WITH DIFFERENTIAL/PLATELET
Basophils Absolute: 0 10*3/uL (ref 0.0–0.1)
Basophils Relative: 0 % (ref 0–1)
Eosinophils Absolute: 0.1 10*3/uL (ref 0.0–0.7)
Eosinophils Relative: 1 % (ref 0–5)
HCT: 33.8 % — ABNORMAL LOW (ref 36.0–46.0)
Hemoglobin: 11.3 g/dL — ABNORMAL LOW (ref 12.0–15.0)
Lymphocytes Relative: 58 % — ABNORMAL HIGH (ref 12–46)
Lymphs Abs: 2.6 10*3/uL (ref 0.7–4.0)
MCH: 30.8 pg (ref 26.0–34.0)
MCHC: 33.4 g/dL (ref 30.0–36.0)
MCV: 92.1 fL (ref 78.0–100.0)
Monocytes Absolute: 0.3 10*3/uL (ref 0.1–1.0)
Monocytes Relative: 6 % (ref 3–12)
Neutro Abs: 1.5 10*3/uL — ABNORMAL LOW (ref 1.7–7.7)
Neutrophils Relative %: 34 % — ABNORMAL LOW (ref 43–77)
Platelets: 279 10*3/uL (ref 150–400)
RBC: 3.67 MIL/uL — ABNORMAL LOW (ref 3.87–5.11)
RDW: 12.9 % (ref 11.5–15.5)
WBC: 4.5 10*3/uL (ref 4.0–10.5)

## 2012-11-01 LAB — CK: Total CK: 205 U/L — ABNORMAL HIGH (ref 7–177)

## 2012-11-01 MED ORDER — CYCLOBENZAPRINE HCL 10 MG PO TABS: 10.0000 mg | ORAL_TABLET | Freq: Two times a day (BID) | ORAL | Status: DC | PRN

## 2012-11-01 MED ORDER — LORAZEPAM 1 MG PO TABS
1.0000 mg | ORAL_TABLET | Freq: Once | ORAL | Status: AC
  Administered 2012-11-01: 1 mg via ORAL
  Filled 2012-11-01: qty 1

## 2012-11-01 MED ORDER — DIPHENHYDRAMINE HCL 25 MG PO CAPS
25.0000 mg | ORAL_CAPSULE | Freq: Once | ORAL | Status: AC
  Administered 2012-11-01: 25 mg via ORAL
  Filled 2012-11-01: qty 1

## 2012-11-01 MED ORDER — HYDROCODONE-ACETAMINOPHEN 7.5-325 MG/15ML PO SOLN
10.0000 mL | Freq: Once | ORAL | Status: AC
  Administered 2012-11-01: 10 mL via ORAL
  Filled 2012-11-01: qty 15

## 2012-11-01 MED ORDER — CYCLOBENZAPRINE HCL 10 MG PO TABS
5.0000 mg | ORAL_TABLET | Freq: Once | ORAL | Status: AC
  Administered 2012-11-01: 5 mg via ORAL
  Filled 2012-11-01: qty 1

## 2012-11-01 MED ORDER — IBUPROFEN 800 MG PO TABS: 800.0000 mg | ORAL_TABLET | Freq: Three times a day (TID) | ORAL | Status: DC

## 2012-11-01 NOTE — ED Notes (Addendum)
Lab called to add on CK from previous specimen that was ordered.

## 2012-11-01 NOTE — ED Notes (Signed)
Pt reports all over body aches for 5 mos. She has experienced all over itching and feeling like things are crawling on her starting last night. She reports she took her pain pills and sleeping pills last night but could not get comfortable. Reports "no one know what is going on with me". Tearful and emotional. RN gave blanket and actively listened.

## 2012-11-01 NOTE — ED Notes (Signed)
Pt is being tx for lyme dz.  Was given doxy, taken off doxy and re-started yesterday.  Pt states she has been experiencing body pain.  However, yesterday the pain increased. States "It's like something's in my body and hurting it, itching it".  C/o R arm and leg numbness.  Pt tearful.

## 2012-11-01 NOTE — ED Provider Notes (Signed)
CSN: [id number]     Arrival date & time 11/01/12  1146 History     First MD Initiated Contact with Patient 11/01/12 1215     Chief Complaint  Patient presents with  . Muscle Pain  . Shortness of Breath   (Consider location/radiation/quality/duration/timing/severity/associated sxs/prior Treatment) HPI 37 year old female with a history of possible Lyme disease presents with pain all over. Patient reports that about 3 or 4 months ago she began having body aches and she came to the emergency department and was diagnosed with myositis, and followed up with rheumatology. Rheumatology reported that the first test looking for Lyme disease was negative however she had a second test a Western blot that was positive and started her on doxycycline. They then referred her to infectious disease, however after infectious disease received the records faxed over reported that this was unlikely Lyme disease and did not see her. Patient reports she was on doxycycline for 10 days starting 3 days ago, and since the doxycycline was discontinued she feels she's had pain all over for the last 3 days. Describes this as cramping in her muscles as well as pain in her joints, and is now localized to any specific joint or muscle group.  Also notes a feeling like "something is crawling" under her skin. Reports a sensation of hot and cold at home, but denies measuring a temperature or fever. Reports feeling very anxious regarding her symptoms, is tearful, and reports his shortness of breath secondary to anxiety. Denies any history of DVT/PE, family history of DVT/PE, estrogen use, recent surgery or immobilization, Amoxil, asymmetric leg pain or swelling.  Past Medical History  Diagnosis Date  . Migraine    Past Surgical History  Procedure Laterality Date  . Cesarean section    . Tubal ligation     No family history on file. History  Substance Use Topics  . Smoking status: Never Smoker   . Smokeless tobacco: Never Used   . Alcohol Use: Yes     Comment: occasionally   OB History   Grav Para Term Preterm Abortions TAB SAB Ect Mult Living                 Review of Systems  Constitutional: Negative for fever.  HENT: Negative for sore throat and neck pain.   Eyes: Negative for visual disturbance.  Respiratory: Negative for cough and shortness of breath.   Cardiovascular: Negative for chest pain.  Gastrointestinal: Negative for nausea, vomiting, abdominal pain and diarrhea.  Genitourinary: Negative for difficulty urinating.  Musculoskeletal: Positive for myalgias, back pain and arthralgias.  Skin: Negative for rash.  Neurological: Negative for syncope and headaches.  All other systems reviewed and are negative.    Allergies  Review of patient's allergies indicates no known allergies.  Home Medications   Current Outpatient Rx  Name  Route  Sig  Dispense  Refill  . amitriptyline (ELAVIL) 25 MG tablet   Oral   Take 25 mg by mouth at bedtime.         Marland Kitchen aspirin-acetaminophen-caffeine (EXCEDRIN MIGRAINE) 250-250-65 MG per tablet   Oral   Take 2 tablets by mouth every 6 (six) hours as needed for pain.         Marland Kitchen doxycycline (VIBRAMYCIN) 100 MG capsule   Oral   Take 100 mg by mouth 2 (two) times daily. For 21 days starting 10/19/12         . HYDROcodone-acetaminophen (NORCO/VICODIN) 5-325 MG per tablet  1 to 2 tabs every 4 to 6 hours as needed for pain.         Marland Kitchen ibuprofen (ADVIL,MOTRIN) 200 MG tablet   Oral   Take 200 mg by mouth every 6 (six) hours as needed for pain or headache.         . iron polysaccharides (NIFEREX) 150 MG capsule   Oral   Take 150 mg by mouth daily.         . cyclobenzaprine (FLEXERIL) 10 MG tablet   Oral   Take 1 tablet (10 mg total) by mouth 2 (two) times daily as needed for muscle spasms.   15 tablet   0    BP 139/103  Pulse 71  Temp(Src) 98.7 F (37.1 C) (Oral)  Resp 20  SpO2 100%  LMP 10/17/2012 Physical Exam  Nursing note and vitals  reviewed. Constitutional: She is oriented to person, place, and time. She appears well-developed and well-nourished. She appears distressed (anxious, tearful).  HENT:  Head: Normocephalic and atraumatic.  Mouth/Throat: No oropharyngeal exudate.  Eyes: Conjunctivae and EOM are normal. Pupils are equal, round, and reactive to light.  Neck: Normal range of motion.  Cardiovascular: Normal rate, regular rhythm, normal heart sounds and intact distal pulses.  Exam reveals no gallop and no friction rub.   No murmur heard. Pulmonary/Chest: Effort normal and breath sounds normal. No respiratory distress. She has no wheezes. She has no rales.  Abdominal: Soft. She exhibits no distension. There is no tenderness. There is no guarding.  Musculoskeletal: She exhibits tenderness (of all muscles). She exhibits no edema.  Neurological: She is alert and oriented to person, place, and time.  Skin: Skin is warm and dry. No rash noted. She is not diaphoretic. No erythema.    ED Course   Procedures (including critical care time)  Labs Reviewed  CBC WITH DIFFERENTIAL - Abnormal; Notable for the following:    RBC 3.67 (*)    Hemoglobin 11.3 (*)    HCT 33.8 (*)    Neutrophils Relative % 34 (*)    Neutro Abs 1.5 (*)    Lymphocytes Relative 58 (*)    All other components within normal limits  COMPREHENSIVE METABOLIC PANEL - Abnormal; Notable for the following:    Glucose, Bld 107 (*)    GFR calc non Af Amer 68 (*)    GFR calc Af Amer 79 (*)    All other components within normal limits  CK - Abnormal; Notable for the following:    Total CK 205 (*)    All other components within normal limits   Date: 11/01/2012  Rate: 94   Rhythm: normal sinus rhythm  QRS Axis: normal  Intervals: normal  ST/T Wave abnormalities: normal  Conduction Disutrbances:none  Narrative Interpretation:     No results found. 1. Myalgia     MDM  37 year old female with a history of possible Lyme disease presents with pain  all over, myalgias, arthralgias. Patient with history of similar in the past, diagnosed with Lyme disease by a rheumatologist and referred to infectious disease, however when the infectious disease reviewed her paperwork and results reported that she did not have Lyme disease and she was not seen. Patient reports she's been off doxycycline for 3 days, and that myalgias and arthralgias have been worsening. On last presentation patient had normal ESR.  CK had been mildly elevated during last emergency department visit in CK was rechecked here and also noted to be mildly elevated to 200. Patient  with diffuse tenderness of her muscles not localized to one group, and without weakness.  CMP and CBC per patient baseline. Unclear cause of patient's myalgias and elevated CPK. Patient denies any medications or other drug use that could be causing this.   Recommended continued close primary care followup for symptoms, and rheumatology followup for all other specialist followup including possible neurology followup given muscular symptoms.  Told she may continue doxycycline given rheumatologist diagnosis of Lyme's disease.  Patient given Flexeril rx.  Patient discharged in stable condition with understanding of reasons to return.   Rhae Lerner, MD 11/01/12 1745

## 2012-11-02 ENCOUNTER — Telehealth (HOSPITAL_COMMUNITY): Payer: Self-pay | Admitting: *Deleted

## 2012-11-05 NOTE — ED Provider Notes (Signed)
I saw and evaluated the patient, reviewed the resident's note and I agree with the findings and plan and agree with their ECG interpretation. Patient with myalgias and pain all over. Has been seeing rheumatology and infectious disease. CK is minimally elevated. Will discharge home. No clear cause for  Juliet Rude. Rubin Payor, MD 11/05/12 [phone]

## 2012-11-27 ENCOUNTER — Encounter: Payer: Self-pay | Admitting: Neurology

## 2012-11-28 ENCOUNTER — Ambulatory Visit (INDEPENDENT_AMBULATORY_CARE_PROVIDER_SITE_OTHER): Payer: 59 | Admitting: Neurology

## 2012-11-28 ENCOUNTER — Encounter: Payer: Self-pay | Admitting: Neurology

## 2012-11-28 VITALS — BP 125/73 | HR 83 | Ht 69.0 in | Wt 229.0 lb

## 2012-11-28 DIAGNOSIS — R519 Headache, unspecified: Secondary | ICD-10-CM | POA: Insufficient documentation

## 2012-11-28 DIAGNOSIS — R51 Headache: Secondary | ICD-10-CM

## 2012-11-28 DIAGNOSIS — R209 Unspecified disturbances of skin sensation: Secondary | ICD-10-CM | POA: Insufficient documentation

## 2012-11-28 DIAGNOSIS — IMO0001 Reserved for inherently not codable concepts without codable children: Secondary | ICD-10-CM | POA: Insufficient documentation

## 2012-11-28 HISTORY — DX: Headache: R51

## 2012-11-28 HISTORY — DX: Reserved for inherently not codable concepts without codable children: IMO0001

## 2012-11-28 NOTE — Progress Notes (Signed)
Reason for visit: Fibromyalgia  Julie Schwartz is a 37 y.o. female  History of present illness:  Julie Schwartz is a 37 year old right-handed black female with a history of neuromuscular type discomfort that began 4 or 5 months ago. The patient began having migratory achy pain in the shoulders, arms, back, and legs. The patient over the last 2 months has developed some numbness on the right side the body involving the arm and leg. The patient has been seen by Dr. Nickola Major, and blood work showed a positive screen for Lyme disease, but the Western blot test was negative. The patient was treated with doxycycline. The patient has also developed daily headaches that are worse in the morning, better as the day goes on. The patient has had some mild elevations in CK enzyme levels, but this has normalized. The patient has had a mildly elevated sedimentation rate. The patient has had extensive blood work testing through Dr. Nickola Major, but the results of this testing are not available to me. The patient indicates that she was checked for sarcoidosis and lupus. The patient is sent to this office for an evaluation of the right-sided sensory changes. The patient also reports exercise intolerance, memory problems, and insomnia. The patient denies that she snores at night. The patient does not report problems controlling the bowels or the bladder, and she does have some mild gait instability.  Past Medical History  Diagnosis Date  . Migraine   . Myalgia   . Anemia   . Myalgia and myositis, unspecified 11/28/2012  . Headache(784.0) 11/28/2012  . Obesity   . Iron deficiency anemia   . Depression     Past Surgical History  Procedure Laterality Date  . Cesarean section    . Tubal ligation    . Carpal tunnel release Right     Family History  Problem Relation Age of Onset  . Depression Father   . Breast cancer Maternal Grandmother   . Arthritis/Rheumatoid Maternal Grandmother   . Lupus Paternal Grandmother      Social history:  reports that she has never smoked. She has never used smokeless tobacco. She reports that  drinks alcohol. She reports that she does not use illicit drugs.  Medications:  Current Outpatient Prescriptions on File Prior to Visit  Medication Sig Dispense Refill  . amitriptyline (ELAVIL) 25 MG tablet Take 25 mg by mouth at bedtime.      Marland Kitchen aspirin-acetaminophen-caffeine (EXCEDRIN MIGRAINE) 250-250-65 MG per tablet Take 2 tablets by mouth every 6 (six) hours as needed for pain.      . cyclobenzaprine (FLEXERIL) 10 MG tablet Take 1 tablet (10 mg total) by mouth 2 (two) times daily as needed for muscle spasms.  15 tablet  0  . HYDROcodone-acetaminophen (NORCO/VICODIN) 5-325 MG per tablet 1 to 2 tabs every 4 to 6 hours as needed for pain.      Marland Kitchen ibuprofen (ADVIL,MOTRIN) 200 MG tablet Take 200 mg by mouth every 6 (six) hours as needed for pain or headache.      . iron polysaccharides (NIFEREX) 150 MG capsule Take 150 mg by mouth daily.       No current facility-administered medications on file prior to visit.     No Known Allergies  ROS:  Out of a complete 14 system review of symptoms, the patient complains only of the following symptoms, and all other reviewed systems are negative.  Weight gain, fatigue Swelling in the legs Itching Anemia Joint pain, joint swelling, achy muscles Memory  loss, confusion, headache, numbness, weakness Depression, insomnia, racing thoughts, change in appetite  Blood pressure 125/73, pulse 83, height 5\' 9"  (1.753 m), weight 229 lb (103.874 kg), last menstrual period 10/17/2012.  Physical Exam  General: The patient is alert and cooperative at the time of the examination. The patient is moderately obese.  Head: Pupils are equal, round, and reactive to light. Discs are flat bilaterally.  Neck: The neck is supple, no carotid bruits are noted.  Respiratory: The respiratory examination is clear.  Cardiovascular: The cardiovascular  examination reveals a regular rate and rhythm, no obvious murmurs or rubs are noted.  Neuromuscular: The patient has good range of movement of the cervical spine and lumbosacral spine.  Skin: Extremities are without significant edema.  Neurologic Exam  Mental status:  Cranial nerves: Facial symmetry is present. There is good sensation of the face to pinprick and soft touch bilaterally. The strength of the facial muscles and the muscles to head turning and shoulder shrug are normal bilaterally. Speech is well enunciated, no aphasia or dysarthria is noted. Extraocular movements are full. Visual fields are full.  Motor: The motor testing reveals 5 over 5 strength of all 4 extremities. Good symmetric motor tone is noted throughout.  Sensory: Sensory testing is intact to pinprick, soft touch, vibration sensation, and position sense on all 4 extremities. No evidence of extinction is noted.  Coordination: Cerebellar testing reveals good finger-nose-finger and heel-to-shin bilaterally.  Gait and station: Gait is normal. Tandem gait is normal. Romberg is negative. No drift is seen.  Reflexes: Deep tendon reflexes are symmetric and normal bilaterally, with the exception that the left ankle jerk reflexes depressed relative to the right. Toes are downgoing bilaterally.   Assessment/Plan:  1. Diffuse neuromuscular pain, probable fibromyalgia   2. Daily headaches  3. Right sided numbness  The patient will be set up for MRI evaluation the brain. If this is unremarkable, nerve conduction studies of the right arm and both legs will be done, with EMG evaluation of the right arm and right leg. Occasionally, sensory changes may be part of a fibromyalgia syndrome. The patient may need to go up on the amitriptyline dose to help her sleep, and help the headache and neuromuscular pain.  Marlan Palau MD 11/28/2012 7:13 PM  [facility] [address] [place], [place]  [address]  Phone [phone] Fax [phone]

## 2012-12-06 ENCOUNTER — Ambulatory Visit (INDEPENDENT_AMBULATORY_CARE_PROVIDER_SITE_OTHER): Payer: 59

## 2012-12-06 DIAGNOSIS — R51 Headache: Secondary | ICD-10-CM

## 2012-12-06 DIAGNOSIS — IMO0001 Reserved for inherently not codable concepts without codable children: Secondary | ICD-10-CM

## 2012-12-06 DIAGNOSIS — R209 Unspecified disturbances of skin sensation: Secondary | ICD-10-CM

## 2012-12-10 ENCOUNTER — Telehealth: Payer: Self-pay | Admitting: Neurology

## 2012-12-10 NOTE — Telephone Encounter (Signed)
I called patient. I left a message. I will call her back tomorrow. MRI the brain shows some small vessel disease, white matter changes. The patient has a history of migraine, these changes could be related to this, but given her symptoms, we will consider getting MRI of the cervical spine, and go ahead and get nerve conductions of both legs, one arm, and EMG evaluation of the right arm and leg. Depending upon the results of the above, lumbar puncture may be considered in the future.

## 2012-12-11 ENCOUNTER — Telehealth: Payer: Self-pay | Admitting: Neurology

## 2012-12-11 ENCOUNTER — Encounter: Payer: Self-pay | Admitting: Neurology

## 2012-12-11 DIAGNOSIS — R209 Unspecified disturbances of skin sensation: Secondary | ICD-10-CM

## 2012-12-11 DIAGNOSIS — R9089 Other abnormal findings on diagnostic imaging of central nervous system: Secondary | ICD-10-CM

## 2012-12-11 NOTE — Telephone Encounter (Signed)
I called the patient. I discussed the MRI results with the patient. I will go on to get MRI evaluation of the cervical spine to look for demyelinating disease. I'll get the patient set up for EMG and nerve conduction study evaluation of the right side. Depending upon the results of the above tests, we will consider him for puncture. The patient does have a history of migraine, and the MRI results may be related to this.

## 2012-12-13 ENCOUNTER — Telehealth: Payer: Self-pay | Admitting: *Deleted

## 2012-12-13 NOTE — Telephone Encounter (Signed)
Need information. I had an MRI and it has not been sent to my PCP nor have i been given the results. Please call

## 2012-12-14 NOTE — Telephone Encounter (Signed)
Dr. Anne Hahn spoke with patient on 12/13/12. Closing this encounter.

## 2012-12-20 ENCOUNTER — Ambulatory Visit (INDEPENDENT_AMBULATORY_CARE_PROVIDER_SITE_OTHER): Payer: 59

## 2012-12-20 ENCOUNTER — Encounter (INDEPENDENT_AMBULATORY_CARE_PROVIDER_SITE_OTHER): Payer: Self-pay | Admitting: Radiology

## 2012-12-20 ENCOUNTER — Ambulatory Visit (INDEPENDENT_AMBULATORY_CARE_PROVIDER_SITE_OTHER): Payer: 59 | Admitting: Neurology

## 2012-12-20 DIAGNOSIS — R9089 Other abnormal findings on diagnostic imaging of central nervous system: Secondary | ICD-10-CM

## 2012-12-20 DIAGNOSIS — R209 Unspecified disturbances of skin sensation: Secondary | ICD-10-CM

## 2012-12-20 DIAGNOSIS — R930 Abnormal findings on diagnostic imaging of skull and head, not elsewhere classified: Secondary | ICD-10-CM

## 2012-12-20 DIAGNOSIS — Z0289 Encounter for other administrative examinations: Secondary | ICD-10-CM

## 2012-12-20 DIAGNOSIS — M797 Fibromyalgia: Secondary | ICD-10-CM

## 2012-12-20 MED ORDER — GADOPENTETATE DIMEGLUMINE 469.01 MG/ML IV SOLN: 20.0000 mL | Freq: Once | INTRAVENOUS | Status: AC | PRN

## 2012-12-20 NOTE — Procedures (Signed)
  HISTORY:  Julie Schwartz is a 37 year old patient with a five-month history of migratory neuromuscular pain. Two months ago, the patient developed numbness involving the right arm and right leg. The patient has developed daily headaches recently, and she has a prior history of migraine headache. The patient is being evaluated for the right-sided numbness.  NERVE CONDUCTION STUDIES:  Nerve conduction studies were performed on the right upper extremity. The distal motor latencies and motor amplitudes for the median and ulnar nerves were within normal limits. The F wave latencies and nerve conduction velocities for these nerves were also normal. The sensory latencies for the median and ulnar nerves were normal.  Nerve conduction studies were performed on the right lower extremity. The distal motor latencies and motor amplitudes for the peroneal and posterior tibial nerves were within normal limits. The nerve conduction velocities for these nerves were also normal. The H reflex latency was normal. The sensory latency for the peroneal nerve was within normal limits.   EMG STUDIES:  EMG study was performed on the right upper extremity:  The first dorsal interosseous muscle reveals 2 to 4 K units with full recruitment. No fibrillations or positive waves were noted. The abductor pollicis brevis muscle reveals 2 to 4 K units with full recruitment. No fibrillations or positive waves were noted. The extensor indicis proprius muscle reveals 1 to 3 K units with full recruitment. No fibrillations or positive waves were noted. The pronator teres muscle reveals 2 to 3 K units with full recruitment. No fibrillations or positive waves were noted. The biceps muscle reveals 1 to 2 K units with full recruitment. No fibrillations or positive waves were noted. The triceps muscle reveals 2 to 4 K units with full recruitment. No fibrillations or positive waves were noted. The anterior deltoid muscle reveals 2 to 3 K  units with full recruitment. No fibrillations or positive waves were noted. The cervical paraspinal muscles were tested at 2 levels. No abnormalities of insertional activity were seen at either level tested. There was good relaxation.  EMG study was performed on the right lower extremity:  The tibialis anterior muscle reveals 2 to 4K motor units with full recruitment. No fibrillations or positive waves were seen. The peroneus tertius muscle reveals 2 to 5K motor units with decreased recruitment. No fibrillations or positive waves were seen. The medial gastrocnemius muscle reveals 1 to 3K motor units with full recruitment. No fibrillations or positive waves were seen. The vastus lateralis muscle reveals 2 to 4K motor units with full recruitment. No fibrillations or positive waves were seen. The iliopsoas muscle reveals 2 to 4K motor units with full recruitment. No fibrillations or positive waves were seen. The biceps femoris muscle (long head) reveals 2 to 4K motor units with full recruitment. No fibrillations or positive waves were seen. The lumbosacral paraspinal muscles were tested at 3 levels, and revealed no abnormalities of insertional activity at all 3 levels tested. There was good relaxation.   IMPRESSION:  Nerve conduction studies done on the right arm and right leg were within normal limits. There is no evidence of a neuropathy. EMG evaluation of the right arm and right leg were normal. There is no evidence of a right cervical right lumbosacral radiculopathy on this evaluation.  Marlan Palau MD 12/20/2012 4:48 PM  [facility] [address] [place], [place] [address]  Phone [phone] Fax [phone]

## 2012-12-20 NOTE — Progress Notes (Signed)
Julie Schwartz is a 37 year old patient with a 5 months history of migratory neuromuscular pain, and a two-month history of right arm right leg numbness. The patient also reports daily headaches. MRI the brain has shown evidence of minimal nonspecific white matter changes. Cervical MRI was unremarkable. The patient comes to the office today for EMG and nerve conduction study.  Nerve conductions on the right arm and leg were unremarkable, and EMG and nerve conduction study on the right arm and leg were unremarkable.  Given the changes by MRI the brain, we will pursue a lumbar puncture for this patient. The patient will be evaluated for demyelinating disease, or neurosarcoidosis. I'll get the study set up.

## 2012-12-21 ENCOUNTER — Telehealth: Payer: Self-pay | Admitting: Neurology

## 2012-12-21 NOTE — Telephone Encounter (Signed)
I Called patient. The patient had MRI of the cervical spine showing a large C5-6 disc, without critical compression of the spinal cord. This could call some of the shoulder discomfort and right-sided numbness. The patient has an abnormal MRI the brain, and she will undergo a lumbar puncture in the near future.

## 2012-12-21 NOTE — Telephone Encounter (Signed)
Dr. Paulita Fujita nurse called about the patient's FMLA form.  She said that her doctor will cover patient for the days that he has seen patient, but wants Dr. Anne Hahn to cover for the days [facility] has seen patient and for tests that we ordered.  I told her that was fine and that patient needed to get [place] the form if it hadn't already been sent to [place].

## 2012-12-24 DIAGNOSIS — Z0289 Encounter for other administrative examinations: Secondary | ICD-10-CM

## 2012-12-25 ENCOUNTER — Other Ambulatory Visit: Payer: Self-pay | Admitting: Neurology

## 2012-12-25 ENCOUNTER — Telehealth: Payer: Self-pay | Admitting: Neurology

## 2012-12-25 DIAGNOSIS — R209 Unspecified disturbances of skin sensation: Secondary | ICD-10-CM

## 2012-12-25 DIAGNOSIS — R9389 Abnormal findings on diagnostic imaging of other specified body structures: Secondary | ICD-10-CM

## 2012-12-26 ENCOUNTER — Telehealth: Payer: Self-pay | Admitting: Neurology

## 2012-12-26 NOTE — Telephone Encounter (Signed)
I returned patient's call and left her a VM that I received her form on December 24 2012. It takes about 14 days to get it complete. There are 18 people ahead or her at this time.

## 2012-12-27 NOTE — Telephone Encounter (Signed)
I returned patient's call. She stated that her primary provider did not fill out her FMLA forms because they did not have access to her notes from [place]. She also stated that she will be calling our office every day until the forms are done and that we have had her forms since the first day we saw he in our office. I let her know that our notes have been in our computer system since her day one and accessible to her primary provider and that every note of every call and e-mail from patient is also in our system. I tried top explain that we received her forms on Friday and, as I stated in a VM yesterday, I have eighteen forms in front of hers and that each time I have to respond to her takes away from my time doing forms. Patient stated she was going to let her primary physician know that we have had forms since day one and won't fill them out and that she will continue to call and email [place] daily until the forms are completed. She had also stated that she had brought the forms to [place] on Friday (Octiber 3 2014) and we are interfering with her receiving leave and affecting her job. I tried to let her know that I only received them after processing, on December 25 2012 but, she had hung up on me.

## 2012-12-27 NOTE — Telephone Encounter (Signed)
This patient likely has fibromyalgia. I am not sure that this is a reason for long-term disability. We will be happy to fill out the FMLA form, I am not sure this patient qualifies for disability, however.

## 2012-12-31 ENCOUNTER — Ambulatory Visit
Admission: RE | Admit: 2012-12-31 | Discharge: 2012-12-31 | Disposition: A | Discharge status: Home / Self Care | Payer: 59 | Source: Ambulatory Visit | Attending: Neurology | Admitting: Neurology

## 2012-12-31 ENCOUNTER — Other Ambulatory Visit: Payer: Self-pay | Admitting: Neurology

## 2012-12-31 VITALS — BP 123/67 | HR 61

## 2012-12-31 DIAGNOSIS — R9389 Abnormal findings on diagnostic imaging of other specified body structures: Secondary | ICD-10-CM

## 2012-12-31 DIAGNOSIS — R51 Headache: Secondary | ICD-10-CM

## 2012-12-31 DIAGNOSIS — IMO0001 Reserved for inherently not codable concepts without codable children: Secondary | ICD-10-CM

## 2012-12-31 DIAGNOSIS — R209 Unspecified disturbances of skin sensation: Secondary | ICD-10-CM

## 2012-12-31 LAB — CRYPTOCOCCAL ANTIGEN, CSF: Crypto Ag: NEGATIVE

## 2012-12-31 NOTE — Progress Notes (Signed)
Pt here for lumbar puncture. Blood drawn from right AC to go with spinal fluid. Pt tolerated blood draw well, site is unremarkable.

## 2012-12-31 NOTE — Progress Notes (Signed)
Discharge instructions explained and pt called ride to pick her up in about 25 minutes.

## 2013-01-01 ENCOUNTER — Telehealth: Payer: Self-pay

## 2013-01-01 LAB — LYME ABY, WSTRN BLT IGG & IGM W/BANDS
B burgdorferi IgG Abs (IB): NEGATIVE
B burgdorferi IgM Abs (IB): NEGATIVE
Lyme Disease 18 kD IgG: NONREACTIVE
Lyme Disease 23 kD IgG: NONREACTIVE
Lyme Disease 23 kD IgM: REACTIVE — AB
Lyme Disease 28 kD IgG: NONREACTIVE
Lyme Disease 30 kD IgG: NONREACTIVE
Lyme Disease 39 kD IgG: NONREACTIVE
Lyme Disease 39 kD IgM: NONREACTIVE
Lyme Disease 41 kD IgG: NONREACTIVE
Lyme Disease 41 kD IgM: NONREACTIVE
Lyme Disease 45 kD IgG: NONREACTIVE
Lyme Disease 58 kD IgG: NONREACTIVE
Lyme Disease 66 kD IgG: NONREACTIVE
Lyme Disease 93 kD IgG: NONREACTIVE

## 2013-01-01 NOTE — Telephone Encounter (Signed)
Patient is calling again stating that she has been inconvenienced because our office did not fax her primary doctor patient's medical records a month ago. As such, her PCP could not submit FMLA forms. I let patient know again that I did not get her forms until December 24 2012 and I have about eight patients ahead of her. I will then be to her forms. Patient continued to persist that we have cause this and the forms needed to be in within 30 days. I reviewed the forms that I received. They were prepared September 10 2012 and were to be returned by September 25 2012. I again reiterated that I only received them on October 6th and that all patients have the same urgency she does. (I should be able to get this form completed today or tomorrow).

## 2013-01-02 ENCOUNTER — Telehealth: Payer: Self-pay | Admitting: *Deleted

## 2013-01-02 ENCOUNTER — Telehealth: Payer: Self-pay | Admitting: Radiology

## 2013-01-02 ENCOUNTER — Other Ambulatory Visit: Payer: Self-pay | Admitting: Neurology

## 2013-01-02 DIAGNOSIS — G971 Other reaction to spinal and lumbar puncture: Secondary | ICD-10-CM

## 2013-01-02 DIAGNOSIS — R519 Headache, unspecified: Secondary | ICD-10-CM

## 2013-01-02 LAB — ANGIOTENSIN CONVERTING ENZYME, CSF: ACE, CSF: 7 U/L (ref <=15)

## 2013-01-02 NOTE — Telephone Encounter (Signed)
Called for an order in for a blood patch.

## 2013-01-02 NOTE — Telephone Encounter (Signed)
Pt had LP on Monday and is now still having a positional headache. Explained blood patch to pt and she said she would like [place] to do this. Will call Dr. Clarisa Kindred office for the order. Message left for Lupita Leash on her voice. Called back and also spoke to the triage nurse.

## 2013-01-02 NOTE — Telephone Encounter (Signed)
Deb from [facility] calling about pt having positional headache.  Pt would like to proceed with blood patch.  Order needed.  [phone] Deb.  Pt. [phone].

## 2013-01-02 NOTE — Telephone Encounter (Signed)
I called and spoke to [facility] and let her know about the blood patch order.

## 2013-01-02 NOTE — Telephone Encounter (Signed)
Called pt to tell her I had not received the order from Dr.Willis's office as of yet.

## 2013-01-03 ENCOUNTER — Ambulatory Visit
Admission: RE | Admit: 2013-01-03 | Discharge: 2013-01-03 | Disposition: A | Discharge status: Home / Self Care | Payer: 59 | Source: Ambulatory Visit | Attending: Neurology | Admitting: Neurology

## 2013-01-03 ENCOUNTER — Other Ambulatory Visit: Payer: 59

## 2013-01-03 VITALS — BP 110/69 | HR 63

## 2013-01-03 DIAGNOSIS — R519 Headache, unspecified: Secondary | ICD-10-CM

## 2013-01-03 MED ORDER — IOHEXOL 180 MG/ML  SOLN
1.0000 mL | Freq: Once | INTRAMUSCULAR | Status: AC | PRN
  Administered 2013-01-03: 1 mL via EPIDURAL

## 2013-01-03 NOTE — Progress Notes (Addendum)
Dr. Bonnielee Haff in to speak to pt. Blood drawn from right AC, site unremarkable and pt tolerated procedure well. J.Lohr RN

## 2013-01-04 ENCOUNTER — Telehealth: Payer: Self-pay | Admitting: Neurology

## 2013-01-04 NOTE — Telephone Encounter (Signed)
I called the patient. So far, spinal fluid analysis looks good. We may need to call the lab to make sure that the oligoclonal banding was done. I am not clear from looking at the lab reports.

## 2013-01-05 LAB — CSF PANEL II
Glucose, CSF: 65 mg/dL (ref 43–76)
Myelin Basic Protein: <2 mcg/L (ref 0.0–4.0)
RBC Count, CSF: 0 cu mm
Total Protein, CSF: 29 mg/dL (ref 15–45)
Tube #: 4
WBC, CSF: 2 cu mm (ref 0–5)

## 2013-01-07 LAB — HSV(HERPES SMPLX VRS)ABS-I+II(IGG)-CSF

## 2013-01-08 ENCOUNTER — Telehealth: Payer: Self-pay | Admitting: Neurology

## 2013-01-08 MED ORDER — PREGABALIN 50 MG PO CAPS: ORAL_CAPSULE | ORAL | Status: DC

## 2013-01-08 NOTE — Telephone Encounter (Signed)
I called patient. Spinal fluid results are negative for oligoclonal banding, follow studies are unremarkable. Spinal fluid is not confirmed demyelinating disease, but he does not completely exclude this either. The patient still having symptoms with headache and neuromuscular pain. The patient could have fibromyalgia. We will recheck a MRI the brain in 6 months to look for progression. I will call in a prescription for Lyrica, the patient is on 50 mg of amitriptyline at night, and she still has a lot of fatigue and neuromuscular pain.

## 2013-01-24 ENCOUNTER — Other Ambulatory Visit: Payer: Self-pay

## 2013-07-09 ENCOUNTER — Telehealth: Payer: Self-pay | Admitting: Neurology

## 2013-07-09 DIAGNOSIS — R9082 White matter disease, unspecified: Secondary | ICD-10-CM

## 2013-07-09 NOTE — Telephone Encounter (Signed)
I called patient. The patient had MRI evaluation in September 2014, we had planned on repeating the MRI study in 6 months to reevaluate the white matter abnormalities. The patient had a prior study done at [facility]. MS workup previously was unremarkable. If the patient is amenable to having the MRI, she is to contact our office and let us know.

## 2013-07-09 NOTE — Telephone Encounter (Signed)
Message copied by Stephanie AcreWILLIS, Lucian Baswell on Tue Jul 09, 2013 11:31 AM ------      Message from: Stephanie AcreWILLIS, Elnor Renovato      Created: Tue Jan 08, 2013  8:01 AM       Recheck MRI of the brain, the comparison study for abnormal brain in past. ------

## 2013-09-26 ENCOUNTER — Encounter (HOSPITAL_COMMUNITY): Payer: Self-pay | Admitting: Emergency Medicine

## 2013-09-26 ENCOUNTER — Emergency Department (HOSPITAL_COMMUNITY)
Admission: EM | Admit: 2013-09-26 | Discharge: 2013-09-26 | Disposition: A | Discharge status: Home / Self Care | Payer: 59 | Attending: Emergency Medicine | Admitting: Emergency Medicine

## 2013-09-26 ENCOUNTER — Emergency Department (HOSPITAL_COMMUNITY): Payer: 59

## 2013-09-26 DIAGNOSIS — F3289 Other specified depressive episodes: Secondary | ICD-10-CM | POA: Insufficient documentation

## 2013-09-26 DIAGNOSIS — R221 Localized swelling, mass and lump, neck: Secondary | ICD-10-CM

## 2013-09-26 DIAGNOSIS — G43909 Migraine, unspecified, not intractable, without status migrainosus: Secondary | ICD-10-CM | POA: Insufficient documentation

## 2013-09-26 DIAGNOSIS — IMO0001 Reserved for inherently not codable concepts without codable children: Secondary | ICD-10-CM | POA: Insufficient documentation

## 2013-09-26 DIAGNOSIS — R220 Localized swelling, mass and lump, head: Secondary | ICD-10-CM | POA: Insufficient documentation

## 2013-09-26 DIAGNOSIS — K0889 Other specified disorders of teeth and supporting structures: Secondary | ICD-10-CM

## 2013-09-26 DIAGNOSIS — E669 Obesity, unspecified: Secondary | ICD-10-CM | POA: Insufficient documentation

## 2013-09-26 DIAGNOSIS — D649 Anemia, unspecified: Secondary | ICD-10-CM | POA: Insufficient documentation

## 2013-09-26 DIAGNOSIS — R1310 Dysphagia, unspecified: Secondary | ICD-10-CM | POA: Insufficient documentation

## 2013-09-26 DIAGNOSIS — K089 Disorder of teeth and supporting structures, unspecified: Secondary | ICD-10-CM | POA: Insufficient documentation

## 2013-09-26 DIAGNOSIS — D509 Iron deficiency anemia, unspecified: Secondary | ICD-10-CM | POA: Insufficient documentation

## 2013-09-26 DIAGNOSIS — Z79899 Other long term (current) drug therapy: Secondary | ICD-10-CM | POA: Insufficient documentation

## 2013-09-26 DIAGNOSIS — F329 Major depressive disorder, single episode, unspecified: Secondary | ICD-10-CM | POA: Insufficient documentation

## 2013-09-26 DIAGNOSIS — R51 Headache: Secondary | ICD-10-CM | POA: Insufficient documentation

## 2013-09-26 LAB — BASIC METABOLIC PANEL
Anion gap: 14 (ref 5–15)
BUN: 15 mg/dL (ref 6–23)
CO2: 20 mEq/L (ref 19–32)
Calcium: 9 mg/dL (ref 8.4–10.5)
Chloride: 104 mEq/L (ref 96–112)
Creatinine, Ser: 0.91 mg/dL (ref 0.50–1.10)
GFR calc Af Amer: >90 mL/min (ref >90)
GFR calc non Af Amer: 81 mL/min — ABNORMAL LOW (ref >90)
Glucose, Bld: 125 mg/dL — ABNORMAL HIGH (ref 70–99)
Potassium: 3.9 mEq/L (ref 3.7–5.3)
Sodium: 138 mEq/L (ref 137–147)

## 2013-09-26 MED ORDER — HYDROMORPHONE HCL PF 1 MG/ML IJ SOLN
0.5000 mg | Freq: Once | INTRAMUSCULAR | Status: AC
  Administered 2013-09-26: 0.5 mg via INTRAVENOUS
  Filled 2013-09-26: qty 1

## 2013-09-26 MED ORDER — SODIUM CHLORIDE 0.9 % IV SOLN
Freq: Once | INTRAVENOUS | Status: AC
  Administered 2013-09-26: 04:00:00 via INTRAVENOUS

## 2013-09-26 MED ORDER — IOHEXOL 300 MG/ML  SOLN
100.0000 mL | Freq: Once | INTRAMUSCULAR | Status: AC | PRN
  Administered 2013-09-26: 100 mL via INTRAVENOUS

## 2013-09-26 MED ORDER — PENICILLIN V POTASSIUM 500 MG PO TABS: 500.0000 mg | ORAL_TABLET | Freq: Three times a day (TID) | ORAL | Status: DC

## 2013-09-26 MED ORDER — OXYCODONE-ACETAMINOPHEN 5-325 MG PO TABS
1.0000 | ORAL_TABLET | ORAL | Status: DC | PRN
Start: 2013-09-26 — End: 2013-11-20

## 2013-09-26 NOTE — ED Provider Notes (Signed)
CSN: [id number]     Arrival date & time 09/26/13  0251 History   First MD Initiated Contact with Patient 09/26/13 [phone]     Chief Complaint  Patient presents with  . Dental Pain     (Consider location/radiation/quality/duration/timing/severity/associated sxs/prior Treatment) Patient is a 38 y.o. female presenting with tooth pain. The history is provided by the patient and the spouse. No language interpreter was used.  Dental Pain Associated symptoms: facial swelling   Associated symptoms: no fever and no headaches   Associated symptoms comment:  She started having pain around the right TMJ yesterday without specific dental pain or issue. The pain woke her during the night extending from TMJ to submental and right neck areas. No fever. She felt at one point like she was having difficulty swallowing but that has resolved. No ear pain, N, V, cough or chest pain.   Past Medical History  Diagnosis Date  . Migraine   . Myalgia   . Anemia   . Myalgia and myositis, unspecified 11/28/2012  . Headache(784.0) 11/28/2012  . Obesity   . Iron deficiency anemia   . Depression    Past Surgical History  Procedure Laterality Date  . Cesarean section    . Tubal ligation    . Carpal tunnel release Right    Family History  Problem Relation Age of Onset  . Depression Father   . Breast cancer Maternal Grandmother   . Arthritis/Rheumatoid Maternal Grandmother   . Lupus Paternal Grandmother    History  Substance Use Topics  . Smoking status: Never Smoker   . Smokeless tobacco: Never Used  . Alcohol Use: Yes     Comment: occasionally   OB History   Grav Para Term Preterm Abortions TAB SAB Ect Mult Living                 Review of Systems  Constitutional: Negative for fever and chills.  HENT: Positive for facial swelling and trouble swallowing. Negative for sore throat.        See HPI.  Respiratory: Negative.  Negative for cough and shortness of breath.   Cardiovascular: Negative.   Negative for chest pain.  Gastrointestinal: Negative.  Negative for nausea.  Skin: Negative.  Negative for color change.  Neurological: Negative.  Negative for headaches.      Allergies  Review of patient's allergies indicates no known allergies.  Home Medications   Prior to Admission medications   Medication Sig Start Date End Date Taking? Authorizing Provider  amitriptyline (ELAVIL) 25 MG tablet Take 50 mg by mouth at bedtime.    Yes Historical Provider, MD  aspirin-acetaminophen-caffeine (EXCEDRIN MIGRAINE) [phone] MG per tablet Take 2 tablets by mouth every 6 (six) hours as needed for pain.   Yes Historical Provider, MD  HYDROcodone-acetaminophen (NORCO/VICODIN) 5-325 MG per tablet 1 to 2 tabs every 4 to 6 hours as needed for pain. 08/24/12  Yes Reuben Likesavid C Keller, MD  iron polysaccharides (NIFEREX) 150 MG capsule Take 150 mg by mouth daily.   Yes Historical Provider, MD   BP 105/92  Pulse 92  Temp(Src) 99.2 F (37.3 C) (Oral)  Resp 18  SpO2 96%  LMP 09/09/2013 Physical Exam  Constitutional: She is oriented to person, place, and time. She appears well-developed and well-nourished.  HENT:  Right Ear: Tympanic membrane and external ear normal.  There is trismus but visualized dentition is good without swelling of gums or obvious abscess or fracture.   Neck: Normal range of motion. No  thyromegaly present.  Tender from submental area into anterolateral right neck with mild swelling. No palpable mass. No tracheal deviation.  Pulmonary/Chest: Effort normal. She exhibits no tenderness.  Lymphadenopathy:    She has no cervical adenopathy.  Neurological: She is alert and oriented to person, place, and time.  Skin: Skin is warm and dry.    ED Course  Procedures (including critical care time) Labs Review Labs Reviewed  BASIC METABOLIC PANEL - Abnormal; Notable for the following:    Glucose, Bld 125 (*)    GFR calc non Af Amer 81 (*)    All other components within normal limits   CBC WITH DIFFERENTIAL  CBC WITH DIFFERENTIAL   Ct Soft Tissue Neck W Contrast  09/26/2013   CLINICAL DATA:  Right-sided neck and dental pain increasing since July 8th.  EXAM: CT NECK WITH CONTRAST  TECHNIQUE: Multidetector CT imaging of the neck was performed using the standard protocol following the bolus administration of intravenous contrast.  CONTRAST:  OMNIPAQUE IOHEXOL 300 MG/ML  SOLN  COMPARISON:  None.  FINDINGS: Normal appearance of the thyroid gland. Laryngeal structures and upper esophagus appear intact. Suggestion of mild enlargement of the tonsils compatible inflammatory process. No evidence of tonsillar or peritonsillar abscess or fluid collection. Adjacent fat planes are intact. No soft tissue swelling in the mucosal or prevertebral spaces. Cervical carotid and jugular vessels are patent without displacement. Salivary glands are symmetrical. Cervical lymph nodes bilaterally without pathologic enlargement, likely reactive. Previous tooth extractions of the left first and bilateral third lower molars and of the left second and third and right third upper molar. Resort shin of the right upper second molar with. Nodule bone loss consistent with periodontal disease and dental caries. No evidence of abscess.  IMPRESSION: No abscess or inflammatory stranding demonstrated in the neck. Multiple tooth extractions. Periodontal changes and dental caries involving the right upper second molar.   Electronically Signed   By: Burman Nieves M.D.   On: 09/26/2013 05:26   Imaging Review No results found.   EKG Interpretation None      MDM   Final diagnoses:  None    Dental pain  CT neck w/CM negative for abscess of soft tissues. Dental caries noted. She feels better with medications here. VSS. Stable for discharge.     Arnoldo Hooker, PA-C 09/26/13 [phone]

## 2013-09-26 NOTE — ED Notes (Signed)
Pt states that she began having right sided dental pain that began around 3pm; pt states that the pain has gotten progressively worse and woke her from sleep tonight

## 2013-09-26 NOTE — Discharge Instructions (Signed)
Dental Care and Dentist Visits °Dental care supports good overall health. Regular dental visits can also help you avoid dental pain, bleeding, infection, and other more serious health problems in the future. It is important to keep the mouth healthy because diseases in the teeth, gums, and other oral tissues can spread to other areas of the body. Some problems, such as diabetes, heart disease, and pre-term labor have been associated with poor oral health.  °See your dentist every 6 months. If you experience emergency problems such as a toothache or broken tooth, go to the dentist right away. If you see your dentist regularly, you may catch problems early. It is easier to be treated for problems in the early stages.  °WHAT TO EXPECT AT A DENTIST VISIT  °Your dentist will look for many common oral health problems and recommend proper treatment. At your regular dental visit, you can expect: °· Gentle cleaning of the teeth and gums. This includes scraping and polishing. This helps to remove the sticky substance around the teeth and gums (plaque). Plaque forms in the mouth shortly after eating. Over time, plaque hardens on the teeth as tartar. If tartar is not removed regularly, it can cause problems. Cleaning also helps remove stains. °· Periodic X-rays. These pictures of the teeth and supporting bone will help your dentist assess the health of your teeth. °· Periodic fluoride treatments. Fluoride is a natural mineral shown to help strengthen teeth. Fluoride treatment involves applying a fluoride gel or varnish to the teeth. It is most commonly done in children. °· Examination of the mouth, tongue, jaws, teeth, and gums to look for any oral health problems, such as: °· Cavities (dental caries). This is decay on the tooth caused by plaque, sugar, and acid in the mouth. It is best to catch a cavity when it is small. °· Inflammation of the gums caused by plaque buildup (gingivitis). °· Problems with the mouth or malformed  or misaligned teeth. °· Oral cancer or other diseases of the soft tissues or jaws.  °KEEP YOUR TEETH AND GUMS HEALTHY °For healthy teeth and gums, follow these general guidelines as well as your dentist's specific advice: °· Have your teeth professionally cleaned at the dentist every 6 months. °· Brush twice daily with a fluoride toothpaste. °· Floss your teeth daily.  °· Ask your dentist if you need fluoride supplements, treatments, or fluoride toothpaste. °· Eat a healthy diet. Reduce foods and drinks with added sugar. °· Avoid smoking. °TREATMENT FOR ORAL HEALTH PROBLEMS °If you have oral health problems, treatment varies depending on the conditions present in your teeth and gums. °· Your caregiver will most likely recommend good oral hygiene at each visit. °· For cavities, gingivitis, or other oral health disease, your caregiver will perform a procedure to treat the problem. This is typically done at a separate appointment. Sometimes your caregiver will refer you to another dental specialist for specific tooth problems or for surgery. °SEEK IMMEDIATE DENTAL CARE IF: °· You have pain, bleeding, or soreness in the gum, tooth, jaw, or mouth area. °· A permanent tooth becomes loose or separated from the gum socket. °· You experience a blow or injury to the mouth or jaw area. °Document Released: 11/17/2010 Document Revised: 05/30/2011 Document Reviewed: 11/17/2010 °Exit[employer]® Patient Information ©2015 [employer]. This information is not intended to replace advice given to you by your health care provider. Make sure you discuss any questions you have with your health care provider. ° °Dental Pain °A tooth ache may be caused   by cavities (tooth decay). Cavities expose the nerve of the tooth to air and hot or cold temperatures. It may come from an infection or abscess (also called a boil or furuncle) around your tooth. It is also often caused by dental caries (tooth decay). This causes the pain you are  having. DIAGNOSIS  Your caregiver can diagnose this problem by exam. TREATMENT   If caused by an infection, it may be treated with medications which kill germs (antibiotics) and pain medications as prescribed by your caregiver. Take medications as directed.  Only take over-the-counter or prescription medicines for pain, discomfort, or fever as directed by your caregiver.  Whether the tooth ache today is caused by infection or dental disease, you should see your dentist as soon as possible for further care. SEEK MEDICAL CARE IF: The exam and treatment you received today has been provided on an emergency basis only. This is not a substitute for complete medical or dental care. If your problem worsens or new problems (symptoms) appear, and you are unable to meet with your dentist, call or return to this location. SEEK IMMEDIATE MEDICAL CARE IF:   You have a fever.  You develop redness and swelling of your face, jaw, or neck.  You are unable to open your mouth.  You have severe pain uncontrolled by pain medicine. MAKE SURE YOU:   Understand these instructions.  Will watch your condition.  Will get help right away if you are not doing well or get worse. Document Released: 03/07/2005 Document Revised: 05/30/2011 Document Reviewed: 10/24/2007 [facility] Patient Information 2015 [place], [place]. This information is not intended to replace advice given to you by your health care provider. Make sure you discuss any questions you have with your health care provider. Dental Caries  Dental caries (also called tooth decay) is the most common oral disease. It can occur at any age, but is more common in children and young adults.  HOW DENTAL CARIES DEVELOPS  The process of decay begins when bacteria and foods (particularly sugars and starches) combine in your mouth to produce plaque. Plaque is a substance that sticks to the hard, outer surface of a tooth (enamel). The bacteria in plaque produce acids  that attack enamel. These acids may also attack the root surface of a tooth (cementum) if it is exposed. Repeated attacks dissolve these surfaces and create holes in the tooth (cavities). If left untreated, the acids destroy the other layers of the tooth.  RISK FACTORS  Frequent sipping of sugary beverages.   Frequent snacking on sugary and starchy foods, especially those that easily get stuck in the teeth.   Poor oral hygiene.   Dry mouth.   Substance abuse such as methamphetamine abuse.   Broken or poor-fitting dental restorations.   Eating disorders.   Gastroesophageal reflux disease (GERD).   Certain radiation treatments to the head and neck. SYMPTOMS In the early stages of dental caries, symptoms are seldom present. Sometimes white, chalky areas may be seen on the enamel or other tooth layers. In later stages, symptoms may include:  Pits and holes on the enamel.  Toothache after sweet, hot, or cold foods or drinks are consumed.  Pain around the tooth.  Swelling around the tooth. DIAGNOSIS  Most of the time, dental caries is detected during a regular dental checkup. A diagnosis is made after a thorough medical and dental history is taken and the surfaces of your teeth are checked for signs of dental caries. Sometimes special instruments, such as  lasers, are used to check for dental caries. Dental X-ray exams may be taken so that areas not visible to the eye (such as between the contact areas of the teeth) can be checked for cavities.  TREATMENT  If dental caries is in its early stages, it may be reversed with a fluoride treatment or an application of a remineralizing agent at the dental office. Thorough brushing and flossing at home is needed to aid these treatments. If it is in its later stages, treatment depends on the location and extent of tooth destruction:   If a small area of the tooth has been destroyed, the destroyed area will be removed and cavities will be  filled with a material such as gold, silver amalgam, or composite resin.   If a large area of the tooth has been destroyed, the destroyed area will be removed and a cap (crown) will be fitted over the remaining tooth structure.   If the center part of the tooth (pulp) is affected, a procedure called a root canal will be needed before a filling or crown can be placed.   If most of the tooth has been destroyed, the tooth may need to be pulled (extracted). HOME CARE INSTRUCTIONS You can prevent, stop, or reverse dental caries at home by practicing good oral hygiene. Good oral hygiene includes:  Thoroughly cleaning your teeth at least twice a day with a toothbrush and dental floss.   Using a fluoride toothpaste. A fluoride mouth rinse may also be used if recommended by your dentist or health care provider.   Restricting the amount of sugary and starchy foods and sugary liquids you consume.   Avoiding frequent snacking on these foods and sipping of these liquids.   Keeping regular visits with a dentist for checkups and cleanings. PREVENTION   Practice good oral hygiene.  Consider a dental sealant. A dental sealant is a coating material that is applied by your dentist to the pits and grooves of teeth. The sealant prevents food from being trapped in them. It may protect the teeth for several years.  Ask about fluoride supplements if you live in a community without fluorinated water or with water that has a low fluoride content. Use fluoride supplements as directed by your dentist or health care provider.  Allow fluoride varnish applications to teeth if directed by your dentist or health care provider. Document Released: 11/27/2001 Document Revised: 11/07/2012 Document Reviewed: 03/09/2012 [facility] Patient Information 2015 [place], [place]. This information is not intended to replace advice given to you by your health care provider. Make sure you discuss any questions you have with your  health care provider.

## 2013-09-26 NOTE — ED Notes (Signed)
Patient transported to CT 

## 2013-09-26 NOTE — ED Provider Notes (Signed)
Medical screening examination/treatment/procedure(s) were performed by non-physician practitioner and as supervising physician I was immediately available for consultation/collaboration.  Megan E DocheShanna Ciscorty, MD 09/26/13 [phone]

## 2013-11-20 ENCOUNTER — Encounter (HOSPITAL_COMMUNITY): Payer: Self-pay | Admitting: Emergency Medicine

## 2013-11-20 ENCOUNTER — Emergency Department (HOSPITAL_COMMUNITY)
Admission: EM | Admit: 2013-11-20 | Discharge: 2013-11-20 | Discharge status: Against Medical Advice | Payer: 59 | Attending: Emergency Medicine | Admitting: Emergency Medicine

## 2013-11-20 DIAGNOSIS — E669 Obesity, unspecified: Secondary | ICD-10-CM | POA: Diagnosis not present

## 2013-11-20 DIAGNOSIS — R1032 Left lower quadrant pain: Secondary | ICD-10-CM | POA: Diagnosis present

## 2013-11-20 LAB — URINALYSIS, ROUTINE W REFLEX MICROSCOPIC
Bilirubin Urine: NEGATIVE
Glucose, UA: NEGATIVE mg/dL
Hgb urine dipstick: NEGATIVE
Ketones, ur: NEGATIVE mg/dL
Leukocytes, UA: NEGATIVE
Nitrite: NEGATIVE
Protein, ur: NEGATIVE mg/dL
Specific Gravity, Urine: 1.018 (ref 1.005–1.030)
Urobilinogen, UA: 1 mg/dL (ref 0.0–1.0)
pH: 6.5 (ref 5.0–8.0)

## 2013-11-20 LAB — POC URINE PREG, ED: Preg Test, Ur: NEGATIVE

## 2013-11-20 NOTE — ED Notes (Signed)
Pt states she is leaving due to possible wait time, encouraged to stay

## 2013-11-20 NOTE — ED Notes (Signed)
The patient said she has been having left, lower abdominal pain that raidates to the flank.  The patient says she has not urinated at all today and has been drinking plenty of water.  She denies any vaginal discharge, vomiting or diarrhea but has been nauseous.  She also says she has been feeling achy and sore like " a cold is coming on".  She rates her pain 8/10.

## 2014-02-15 ENCOUNTER — Emergency Department (HOSPITAL_COMMUNITY): Payer: 59

## 2014-02-15 ENCOUNTER — Emergency Department (HOSPITAL_COMMUNITY)
Admission: EM | Admit: 2014-02-15 | Discharge: 2014-02-16 | Disposition: A | Discharge status: Home / Self Care | Payer: 59 | Attending: Emergency Medicine | Admitting: Emergency Medicine

## 2014-02-15 ENCOUNTER — Encounter (HOSPITAL_COMMUNITY): Payer: Self-pay | Admitting: Nurse Practitioner

## 2014-02-15 DIAGNOSIS — Z3202 Encounter for pregnancy test, result negative: Secondary | ICD-10-CM | POA: Diagnosis not present

## 2014-02-15 DIAGNOSIS — Z9889 Other specified postprocedural states: Secondary | ICD-10-CM | POA: Insufficient documentation

## 2014-02-15 DIAGNOSIS — Z8739 Personal history of other diseases of the musculoskeletal system and connective tissue: Secondary | ICD-10-CM | POA: Insufficient documentation

## 2014-02-15 DIAGNOSIS — F329 Major depressive disorder, single episode, unspecified: Secondary | ICD-10-CM | POA: Insufficient documentation

## 2014-02-15 DIAGNOSIS — R109 Unspecified abdominal pain: Secondary | ICD-10-CM | POA: Diagnosis present

## 2014-02-15 DIAGNOSIS — R1012 Left upper quadrant pain: Secondary | ICD-10-CM | POA: Diagnosis not present

## 2014-02-15 DIAGNOSIS — Z79899 Other long term (current) drug therapy: Secondary | ICD-10-CM | POA: Insufficient documentation

## 2014-02-15 DIAGNOSIS — D509 Iron deficiency anemia, unspecified: Secondary | ICD-10-CM | POA: Insufficient documentation

## 2014-02-15 DIAGNOSIS — E669 Obesity, unspecified: Secondary | ICD-10-CM | POA: Diagnosis not present

## 2014-02-15 DIAGNOSIS — G43909 Migraine, unspecified, not intractable, without status migrainosus: Secondary | ICD-10-CM | POA: Diagnosis not present

## 2014-02-15 DIAGNOSIS — Z9851 Tubal ligation status: Secondary | ICD-10-CM | POA: Insufficient documentation

## 2014-02-15 LAB — CBC WITH DIFFERENTIAL/PLATELET
Basophils Absolute: 0 10*3/uL (ref 0.0–0.1)
Basophils Relative: 0 % (ref 0–1)
Eosinophils Absolute: 0.1 10*3/uL (ref 0.0–0.7)
Eosinophils Relative: 2 % (ref 0–5)
HCT: 33.6 % — ABNORMAL LOW (ref 36.0–46.0)
Hemoglobin: 11 g/dL — ABNORMAL LOW (ref 12.0–15.0)
Lymphocytes Relative: 56 % — ABNORMAL HIGH (ref 12–46)
Lymphs Abs: 3.6 10*3/uL (ref 0.7–4.0)
MCH: 30.1 pg (ref 26.0–34.0)
MCHC: 32.7 g/dL (ref 30.0–36.0)
MCV: 91.8 fL (ref 78.0–100.0)
Monocytes Absolute: 0.3 10*3/uL (ref 0.1–1.0)
Monocytes Relative: 5 % (ref 3–12)
Neutro Abs: 2.4 10*3/uL (ref 1.7–7.7)
Neutrophils Relative %: 37 % — ABNORMAL LOW (ref 43–77)
Platelets: 303 10*3/uL (ref 150–400)
RBC: 3.66 MIL/uL — ABNORMAL LOW (ref 3.87–5.11)
RDW: 13.1 % (ref 11.5–15.5)
WBC: 6.4 10*3/uL (ref 4.0–10.5)

## 2014-02-15 LAB — BASIC METABOLIC PANEL
Anion gap: 13 (ref 5–15)
BUN: 16 mg/dL (ref 6–23)
CO2: 26 mEq/L (ref 19–32)
Calcium: 9.3 mg/dL (ref 8.4–10.5)
Chloride: 104 mEq/L (ref 96–112)
Creatinine, Ser: 1.05 mg/dL (ref 0.50–1.10)
GFR calc Af Amer: 78 mL/min — ABNORMAL LOW (ref >90)
GFR calc non Af Amer: 67 mL/min — ABNORMAL LOW (ref >90)
Glucose, Bld: 101 mg/dL — ABNORMAL HIGH (ref 70–99)
Potassium: 3.7 mEq/L (ref 3.7–5.3)
Sodium: 143 mEq/L (ref 137–147)

## 2014-02-15 LAB — URINALYSIS, ROUTINE W REFLEX MICROSCOPIC
Bilirubin Urine: NEGATIVE
Glucose, UA: NEGATIVE mg/dL
Hgb urine dipstick: NEGATIVE
Ketones, ur: NEGATIVE mg/dL
Leukocytes, UA: NEGATIVE
Nitrite: NEGATIVE
Protein, ur: NEGATIVE mg/dL
Specific Gravity, Urine: 1.028 (ref 1.005–1.030)
Urobilinogen, UA: 0.2 mg/dL (ref 0.0–1.0)
pH: 6 (ref 5.0–8.0)

## 2014-02-15 LAB — PREGNANCY, URINE: Preg Test, Ur: NEGATIVE

## 2014-02-15 MED ORDER — MORPHINE SULFATE 4 MG/ML IJ SOLN
4.0000 mg | Freq: Once | INTRAMUSCULAR | Status: AC
  Administered 2014-02-15: 4 mg via INTRAVENOUS
  Filled 2014-02-15: qty 1

## 2014-02-15 MED ORDER — ONDANSETRON HCL 4 MG/2ML IJ SOLN
4.0000 mg | Freq: Once | INTRAMUSCULAR | Status: AC
  Administered 2014-02-15: 4 mg via INTRAVENOUS
  Filled 2014-02-15: qty 2

## 2014-02-15 NOTE — ED Notes (Signed)
Pt ambulatory to exam room with steady gait.  

## 2014-02-15 NOTE — ED Provider Notes (Signed)
CSN: [id number]     Arrival date & time 02/15/14  1919 History   First MD Initiated Contact with Patient 02/15/14 2150     Chief Complaint  Patient presents with  . Flank Pain    Left Flank     (Consider location/radiation/quality/duration/timing/severity/associated sxs/prior Treatment) HPI Comments: Patient presents emergency department with chief complaint of left flank pain. She states symptoms started last night. She denies any known mechanism of injury. She states the pain is 9 out of 10, and radiates to her back. She denies any associated fevers, chills, nausea, vomiting, diarrhea, discharge, or vaginal discharge. She has not tried taking anything to alleviate her symptoms. She denies any history of same. Pain is aggravated with movement and palpation. There are no relieving factors.  The history is provided by the patient. No language interpreter was used.    Past Medical History  Diagnosis Date  . Migraine   . Myalgia   . Anemia   . Myalgia and myositis, unspecified 11/28/2012  . Headache(784.0) 11/28/2012  . Obesity   . Iron deficiency anemia   . Depression    Past Surgical History  Procedure Laterality Date  . Cesarean section    . Tubal ligation    . Carpal tunnel release Right    Family History  Problem Relation Age of Onset  . Depression Father   . Breast cancer Maternal Grandmother   . Arthritis/Rheumatoid Maternal Grandmother   . Lupus Paternal Grandmother    History  Substance Use Topics  . Smoking status: Never Smoker   . Smokeless tobacco: Never Used  . Alcohol Use: Yes     Comment: occasionally   OB History    No data available     Review of Systems  Constitutional: Negative for fever and chills.  Respiratory: Negative for shortness of breath.   Cardiovascular: Negative for chest pain.  Gastrointestinal: Negative for nausea, vomiting, diarrhea and constipation.  Genitourinary: Positive for flank pain. Negative for dysuria.  All other systems  reviewed and are negative.     Allergies  Review of patient's allergies indicates no known allergies.  Home Medications   Prior to Admission medications   Medication Sig Start Date End Date Taking? Authorizing Provider  amitriptyline (ELAVIL) 25 MG tablet Take 50 mg by mouth at bedtime.    Yes Historical Provider, MD  gabapentin (NEURONTIN) 100 MG capsule Take 100 mg by mouth at bedtime.   Yes Historical Provider, MD  HYDROcodone-acetaminophen (NORCO/VICODIN) 5-325 MG per tablet Take 1 tablet by mouth daily as needed for moderate pain (pain). 1 to 2 tabs every 4 to 6 hours as needed for pain. 08/24/12  Yes Reuben Likes, MD  iron polysaccharides (NIFEREX) 150 MG capsule Take 150 mg by mouth daily.   Yes Historical Provider, MD  aspirin-acetaminophen-caffeine (EXCEDRIN MIGRAINE) [phone] MG per tablet Take 2 tablets by mouth every 6 (six) hours as needed for pain.    Historical Provider, MD   BP 150/82 mmHg  Pulse 73  Temp(Src) 98.4 F (36.9 C) (Oral)  Resp 20  Ht 5\' 9"  (1.753 m)  Wt 210 lb (95.255 kg)  BMI 31.00 kg/m2  SpO2 100%  LMP 02/01/2014 Physical Exam  Constitutional: She is oriented to person, place, and time. She appears well-developed and well-nourished.  HENT:  Head: Normocephalic and atraumatic.  Eyes: Conjunctivae and EOM are normal. Pupils are equal, round, and reactive to light.  Neck: Normal range of motion. Neck supple.  Cardiovascular: Normal rate and regular  rhythm.  Exam reveals no gallop and no friction rub.   No murmur heard. Pulmonary/Chest: Effort normal and breath sounds normal. No respiratory distress. She has no wheezes. She has no rales. She exhibits no tenderness.  Abdominal: Soft. Bowel sounds are normal. She exhibits no distension and no mass. There is tenderness. There is no rebound and no guarding.  Left flank and abdominal pain, no right sided abdominal tenderness  Musculoskeletal: Normal range of motion. She exhibits no edema or tenderness.   Neurological: She is alert and oriented to person, place, and time.  Skin: Skin is warm and dry.  Psychiatric: She has a normal mood and affect. Her behavior is normal. Judgment and thought content normal.  Nursing note and vitals reviewed.   ED Course  Procedures (including critical care time) Results for orders placed or performed during the hospital encounter of 02/15/14  Basic metabolic panel  Result Value Ref Range   Sodium 143 137 - 147 mEq/L   Potassium 3.7 3.7 - 5.3 mEq/L   Chloride 104 96 - 112 mEq/L   CO2 26 19 - 32 mEq/L   Glucose, Bld 101 (H) 70 - 99 mg/dL   BUN 16 6 - 23 mg/dL   Creatinine, Ser 4.091.05 0.50 - 1.10 mg/dL   Calcium 9.3 8.4 - 81.110.5 mg/dL   GFR calc non Af Amer 67 (L) >90 mL/min   GFR calc Af Amer 78 (L) >90 mL/min   Anion gap 13 5 - 15  CBC WITH DIFFERENTIAL  Result Value Ref Range   WBC 6.4 4.0 - 10.5 K/uL   RBC 3.66 (L) 3.87 - 5.11 MIL/uL   Hemoglobin 11.0 (L) 12.0 - 15.0 g/dL   HCT 91.433.6 (L) 78.236.0 - 95.646.0 %   MCV 91.8 78.0 - 100.0 fL   MCH 30.1 26.0 - 34.0 pg   MCHC 32.7 30.0 - 36.0 g/dL   RDW 21.313.1 08.611.5 - 57.815.5 %   Platelets 303 150 - 400 K/uL   Neutrophils Relative % 37 (L) 43 - 77 %   Neutro Abs 2.4 1.7 - 7.7 K/uL   Lymphocytes Relative 56 (H) 12 - 46 %   Lymphs Abs 3.6 0.7 - 4.0 K/uL   Monocytes Relative 5 3 - 12 %   Monocytes Absolute 0.3 0.1 - 1.0 K/uL   Eosinophils Relative 2 0 - 5 %   Eosinophils Absolute 0.1 0.0 - 0.7 K/uL   Basophils Relative 0 0 - 1 %   Basophils Absolute 0.0 0.0 - 0.1 K/uL  Urinalysis with microscopic  Result Value Ref Range   Color, Urine YELLOW YELLOW   APPearance CLEAR CLEAR   Specific Gravity, Urine 1.028 1.005 - 1.030   pH 6.0 5.0 - 8.0   Glucose, UA NEGATIVE NEGATIVE mg/dL   Hgb urine dipstick NEGATIVE NEGATIVE   Bilirubin Urine NEGATIVE NEGATIVE   Ketones, ur NEGATIVE NEGATIVE mg/dL   Protein, ur NEGATIVE NEGATIVE mg/dL   Urobilinogen, UA 0.2 0.0 - 1.0 mg/dL   Nitrite NEGATIVE NEGATIVE   Leukocytes, UA  NEGATIVE NEGATIVE  Pregnancy, urine  Result Value Ref Range   Preg Test, Ur NEGATIVE NEGATIVE   Ct Abdomen Pelvis Wo Contrast  02/15/2014   CLINICAL DATA:  Acute onset of left flank pain starting last night, radiating to the back. Initial encounter.  EXAM: CT ABDOMEN AND PELVIS WITHOUT CONTRAST  TECHNIQUE: Multidetector CT imaging of the abdomen and pelvis was performed following the standard protocol without IV contrast.  COMPARISON:  Pelvic ultrasound performed  04/16/2004  FINDINGS: The visualized lung bases are clear.  The liver and spleen are unremarkable in appearance. The gallbladder is within normal limits. The pancreas and adrenal glands are unremarkable.  The kidneys are unremarkable in appearance. There is no evidence of hydronephrosis. No renal or ureteral stones are seen. No perinephric stranding is appreciated.  No free fluid is identified. The small bowel is unremarkable in appearance. The stomach is within normal limits. No acute vascular abnormalities are seen.  The appendix is normal in caliber, without evidence for appendicitis. The colon is unremarkable in appearance.  The bladder is decompressed and not well assessed. The uterus is within normal limits. The ovaries are relatively symmetric, though difficult to fully assess. No suspicious adnexal masses are seen. No inguinal lymphadenopathy is seen.  No acute osseous abnormalities are identified.  IMPRESSION: Unremarkable noncontrast CT of the abdomen and pelvis. No evidence of hydronephrosis; no renal or ureteral stones seen.   Electronically Signed   By: Roanna RaiderJeffery  Chang M.D.   On: 02/15/2014 23:59     Imaging Review No results found.   EKG Interpretation None      MDM   Final diagnoses:  Left flank pain    Patient with left upper flank pain.  No n/v/d, dysuria, vaginal discharge.  Flank pain is somewhat colicky in nature, no hematuria, but consider KS.  Will check CT.  No pelvic pain.  No lower abdominal pain.  CT and  labs and reassuring.  Abdomen is soft.  Patient states that she feels well enough to go home.  Offered pain medication, but patient refused.  She states that she will follow-up with her PCP.  She is stable and ready for discharge.    Roxy Horsemanobert Quintrell Baze, PA-C 02/16/14 0038  Layla MawKristen N Ward, DO 02/16/14 2126

## 2014-02-15 NOTE — ED Notes (Signed)
Pt c/o left flank pain 9/10 radiating to the back, denies n/v/d, fevers or chills. Denies any urinary symptoms or GI symptoms. Pt in NAD.

## 2014-02-16 NOTE — Discharge Instructions (Signed)

## 2014-04-14 ENCOUNTER — Encounter (HOSPITAL_COMMUNITY): Payer: Self-pay | Admitting: Emergency Medicine

## 2014-04-14 ENCOUNTER — Emergency Department (HOSPITAL_COMMUNITY)
Admission: EM | Admit: 2014-04-14 | Discharge: 2014-04-14 | Disposition: A | Discharge status: Home / Self Care | Payer: 59 | Attending: Emergency Medicine | Admitting: Emergency Medicine

## 2014-04-14 DIAGNOSIS — S3992XA Unspecified injury of lower back, initial encounter: Secondary | ICD-10-CM | POA: Insufficient documentation

## 2014-04-14 DIAGNOSIS — Y998 Other external cause status: Secondary | ICD-10-CM | POA: Insufficient documentation

## 2014-04-14 DIAGNOSIS — Z79899 Other long term (current) drug therapy: Secondary | ICD-10-CM | POA: Diagnosis not present

## 2014-04-14 DIAGNOSIS — E669 Obesity, unspecified: Secondary | ICD-10-CM | POA: Insufficient documentation

## 2014-04-14 DIAGNOSIS — Y9389 Activity, other specified: Secondary | ICD-10-CM | POA: Insufficient documentation

## 2014-04-14 DIAGNOSIS — Z862 Personal history of diseases of the blood and blood-forming organs and certain disorders involving the immune mechanism: Secondary | ICD-10-CM | POA: Diagnosis not present

## 2014-04-14 DIAGNOSIS — G43909 Migraine, unspecified, not intractable, without status migrainosus: Secondary | ICD-10-CM | POA: Insufficient documentation

## 2014-04-14 DIAGNOSIS — Y92410 Unspecified street and highway as the place of occurrence of the external cause: Secondary | ICD-10-CM | POA: Diagnosis not present

## 2014-04-14 MED ORDER — METHOCARBAMOL 500 MG PO TABS: 500.0000 mg | ORAL_TABLET | Freq: Two times a day (BID) | ORAL | Status: DC

## 2014-04-14 MED ORDER — IBUPROFEN 800 MG PO TABS: 800.0000 mg | ORAL_TABLET | Freq: Three times a day (TID) | ORAL | Status: DC

## 2014-04-14 NOTE — Discharge Instructions (Signed)
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
## 2014-06-17 ENCOUNTER — Emergency Department (HOSPITAL_COMMUNITY)
Admission: EM | Admit: 2014-06-17 | Discharge: 2014-06-17 | Discharge status: Against Medical Advice | Payer: 59 | Attending: Emergency Medicine | Admitting: Emergency Medicine

## 2014-06-17 ENCOUNTER — Emergency Department (INDEPENDENT_AMBULATORY_CARE_PROVIDER_SITE_OTHER)
Admission: EM | Admit: 2014-06-17 | Discharge: 2014-06-17 | Disposition: A | Discharge status: Home / Self Care | Payer: 59 | Source: Home / Self Care | Attending: Emergency Medicine | Admitting: Emergency Medicine

## 2014-06-17 ENCOUNTER — Encounter (HOSPITAL_COMMUNITY): Payer: Self-pay | Admitting: Emergency Medicine

## 2014-06-17 DIAGNOSIS — Z5321 Procedure and treatment not carried out due to patient leaving prior to being seen by health care provider: Secondary | ICD-10-CM | POA: Insufficient documentation

## 2014-06-17 DIAGNOSIS — E669 Obesity, unspecified: Secondary | ICD-10-CM | POA: Insufficient documentation

## 2014-06-17 DIAGNOSIS — M79601 Pain in right arm: Secondary | ICD-10-CM | POA: Diagnosis not present

## 2014-06-17 DIAGNOSIS — M79604 Pain in right leg: Secondary | ICD-10-CM

## 2014-06-17 DIAGNOSIS — J029 Acute pharyngitis, unspecified: Secondary | ICD-10-CM | POA: Diagnosis not present

## 2014-06-17 LAB — POCT RAPID STREP A: Streptococcus, Group A Screen (Direct): NEGATIVE

## 2014-06-17 MED ORDER — AMOXICILLIN 500 MG PO CAPS: 500.0000 mg | ORAL_CAPSULE | Freq: Two times a day (BID) | ORAL | Status: DC

## 2014-06-17 NOTE — ED Notes (Signed)
Pt states that her throat has been sore starting today and that her right side of her body is sore.

## 2014-06-17 NOTE — ED Notes (Signed)
Pt stated that after seeing how many people were in the lobby, she was going to urgent care.

## 2014-06-17 NOTE — Discharge Instructions (Signed)
Take amoxicillin twice a day for 10 days for the sore throat.  You can try taking 3 gabapentin tablets at night to help with the pain. Please follow-up with your regular doctor in the next week or so.

## 2014-06-17 NOTE — ED Notes (Signed)
Called pt back to triage, however no response from lobby.

## 2014-06-17 NOTE — ED Provider Notes (Signed)
CSN: [id number]     Arrival date & time 06/17/14  1534 History   First MD Initiated Contact with Patient 06/17/14 1648     Chief Complaint  Patient presents with  . Sore Throat  . Generalized Body Aches    pt states that she is sore on right side on body   (Consider location/radiation/quality/duration/timing/severity/associated sxs/prior Treatment) HPI  She is a 39 year old woman here for evaluation of sore throat and right arm and leg pain.  The sore throat started this morning. It is associated with some voice hoarseness. No fevers or chills. No nausea. No nasal congestion or rhinorrhea. She is tolerating liquids well. The pain is worse with swallowing.  The right arm and leg pain has been present for about 2 weeks. It comes and goes. She describes a shooting type of pain that will start in her shoulder and go down her right side. It is associated with some numbness. The numbness is different from her typical fibromyalgia pain.  Past Medical History  Diagnosis Date  . Migraine   . Myalgia   . Anemia   . Myalgia and myositis, unspecified 11/28/2012  . Headache(784.0) 11/28/2012  . Obesity   . Iron deficiency anemia   . Depression    Past Surgical History  Procedure Laterality Date  . Cesarean section    . Tubal ligation    . Carpal tunnel release Right    Family History  Problem Relation Age of Onset  . Depression Father   . Breast cancer Maternal Grandmother   . Arthritis/Rheumatoid Maternal Grandmother   . Lupus Paternal Grandmother    History  Substance Use Topics  . Smoking status: Never Smoker   . Smokeless tobacco: Never Used  . Alcohol Use: Yes     Comment: occasionally   OB History    No data available     Review of Systems As in history of present illness Allergies  Review of patient's allergies indicates no known allergies.  Home Medications   Prior to Admission medications   Medication Sig Start Date End Date Taking? Authorizing Provider   amitriptyline (ELAVIL) 25 MG tablet Take 50 mg by mouth at bedtime.     Historical Provider, MD  amoxicillin (AMOXIL) 500 MG capsule Take 1 capsule (500 mg total) by mouth 2 (two) times daily. 06/17/14   Charm RingsErin J Marc Leichter, MD  aspirin-acetaminophen-caffeine (EXCEDRIN MIGRAINE) [id number] MG per tablet Take 2 tablets by mouth every 6 (six) hours as needed for pain.    Historical Provider, MD  gabapentin (NEURONTIN) 100 MG capsule Take 100 mg by mouth at bedtime.    Historical Provider, MD  HYDROcodone-acetaminophen (NORCO/VICODIN) 5-325 MG per tablet Take 1 tablet by mouth daily as needed for moderate pain (pain). 1 to 2 tabs every 4 to 6 hours as needed for pain. 08/24/12   Reuben Likesavid C Keller, MD  ibuprofen (ADVIL,MOTRIN) 800 MG tablet Take 1 tablet (800 mg total) by mouth 3 (three) times daily. 04/14/14   Fayrene HelperBowie Tran, PA-C  iron polysaccharides (NIFEREX) 150 MG capsule Take 150 mg by mouth daily.    Historical Provider, MD  methocarbamol (ROBAXIN) 500 MG tablet Take 1 tablet (500 mg total) by mouth 2 (two) times daily. 04/14/14   Fayrene HelperBowie Tran, PA-C   BP 118/78 mmHg  Pulse 87  Temp(Src) 97.5 F (36.4 C) (Oral)  Resp 16  SpO2 99%  LMP 05/31/2014 Physical Exam  Constitutional: She is oriented to person, place, and time. She appears well-developed and well-nourished. No  distress.  HENT:  Mouth/Throat: Posterior oropharyngeal erythema present. No oropharyngeal exudate.  Cardiovascular: Normal rate, regular rhythm and normal heart sounds.   No murmur heard. Pulmonary/Chest: Breath sounds normal. No respiratory distress. She has no wheezes. She has no rales.  Neurological: She is alert and oriented to person, place, and time. She displays normal reflexes. She exhibits normal muscle tone.  5 out of 5 strength in all extremities, pain with strength testing in the right arm and leg.    ED Course  Procedures (including critical care time) Labs Review Labs Reviewed  POCT RAPID STREP A ([facility] ONLY)     Imaging Review No results found.   MDM   1. Pharyngitis   2. Right arm pain   3. Right leg pain    Rapid strep is negative, but I am concerned about bacterial pharyngitis. We'll treat with amoxicillin for 10 days.  Her right arm and leg pain is likely related to her fibromyalgia. Her neuro exam is normal making stroke very unlikely. We discussed doing a prednisone burst, but she does not tolerate steroids well. We also discussed increasing her gabapentin to 300 mg at bedtime. She will think about this. She will follow-up with her PCP as soon as possible.    Charm Rings, MD 06/17/14 [id number]

## 2014-06-21 LAB — CULTURE, GROUP A STREP

## 2014-06-24 ENCOUNTER — Telehealth (HOSPITAL_COMMUNITY): Payer: Self-pay | Admitting: *Deleted

## 2014-06-24 NOTE — ED Notes (Signed)
Throat culture: Strep beta hemolytic not group A.  I called pt. Pt. verified x 2 and given result.  Pt. told she was adequately treated with Amoxicillin.  Pt. said she still has a scratchy throat. Pt. told to finish all of medication. If not better to get rechecked.  She can try usual throat remedies like salt water gargle and spit, throat lozengers or Chloraseptic spray.  She asked if it was allergies.  I told her I did not know but she could try and OTC allergy medicine like Zyrtec to see if it helps. Julie Schwartz, Julie Schwartz 06/24/2014

## 2014-07-09 ENCOUNTER — Telehealth: Payer: Self-pay | Admitting: Neurology

## 2014-07-09 NOTE — Addendum Note (Signed)
Addended by: Stephanie AcreWILLIS, CHARLES on: 07/09/2014 01:03 PM   Modules accepted: Orders

## 2014-07-09 NOTE — Telephone Encounter (Signed)
I will reorder the MRI the brain. We will need to compare to the prior study done on September 2014.

## 2014-07-09 NOTE — Telephone Encounter (Signed)
Patient stated she would like to proceed with MRI as discussed on 07/09/13 with Dr. Anne HahnWillis.  Please call and advise.

## 2014-07-09 NOTE — Telephone Encounter (Signed)
error 

## 2014-07-15 ENCOUNTER — Encounter (HOSPITAL_COMMUNITY): Payer: Self-pay | Admitting: Emergency Medicine

## 2014-07-15 ENCOUNTER — Emergency Department (HOSPITAL_COMMUNITY)
Admission: EM | Admit: 2014-07-15 | Discharge: 2014-07-15 | Disposition: A | Discharge status: Home / Self Care | Payer: 59 | Attending: Emergency Medicine | Admitting: Emergency Medicine

## 2014-07-15 ENCOUNTER — Emergency Department (HOSPITAL_COMMUNITY): Payer: 59

## 2014-07-15 DIAGNOSIS — F329 Major depressive disorder, single episode, unspecified: Secondary | ICD-10-CM | POA: Diagnosis not present

## 2014-07-15 DIAGNOSIS — Z791 Long term (current) use of non-steroidal anti-inflammatories (NSAID): Secondary | ICD-10-CM | POA: Diagnosis not present

## 2014-07-15 DIAGNOSIS — D509 Iron deficiency anemia, unspecified: Secondary | ICD-10-CM | POA: Insufficient documentation

## 2014-07-15 DIAGNOSIS — R0789 Other chest pain: Secondary | ICD-10-CM | POA: Insufficient documentation

## 2014-07-15 DIAGNOSIS — G43909 Migraine, unspecified, not intractable, without status migrainosus: Secondary | ICD-10-CM | POA: Diagnosis not present

## 2014-07-15 DIAGNOSIS — E669 Obesity, unspecified: Secondary | ICD-10-CM | POA: Insufficient documentation

## 2014-07-15 DIAGNOSIS — F419 Anxiety disorder, unspecified: Secondary | ICD-10-CM | POA: Diagnosis not present

## 2014-07-15 DIAGNOSIS — Z79899 Other long term (current) drug therapy: Secondary | ICD-10-CM | POA: Insufficient documentation

## 2014-07-15 DIAGNOSIS — Z792 Long term (current) use of antibiotics: Secondary | ICD-10-CM | POA: Insufficient documentation

## 2014-07-15 DIAGNOSIS — Z3202 Encounter for pregnancy test, result negative: Secondary | ICD-10-CM | POA: Insufficient documentation

## 2014-07-15 DIAGNOSIS — M797 Fibromyalgia: Secondary | ICD-10-CM | POA: Insufficient documentation

## 2014-07-15 DIAGNOSIS — R079 Chest pain, unspecified: Secondary | ICD-10-CM | POA: Diagnosis present

## 2014-07-15 HISTORY — DX: Panic disorder (episodic paroxysmal anxiety): F41.0

## 2014-07-15 HISTORY — DX: Fibromyalgia: M79.7

## 2014-07-15 LAB — BASIC METABOLIC PANEL
Anion gap: 8 (ref 5–15)
BUN: 10 mg/dL (ref 6–23)
CO2: 27 mmol/L (ref 19–32)
Calcium: 9.2 mg/dL (ref 8.4–10.5)
Chloride: 105 mmol/L (ref 96–112)
Creatinine, Ser: 0.94 mg/dL (ref 0.50–1.10)
GFR calc Af Amer: 89 mL/min — ABNORMAL LOW (ref >90)
GFR calc non Af Amer: 77 mL/min — ABNORMAL LOW (ref >90)
Glucose, Bld: 115 mg/dL — ABNORMAL HIGH (ref 70–99)
Potassium: 3.5 mmol/L (ref 3.5–5.1)
Sodium: 140 mmol/L (ref 135–145)

## 2014-07-15 LAB — CBC
HCT: 35.1 % — ABNORMAL LOW (ref 36.0–46.0)
Hemoglobin: 11 g/dL — ABNORMAL LOW (ref 12.0–15.0)
MCH: 29.6 pg (ref 26.0–34.0)
MCHC: 31.3 g/dL (ref 30.0–36.0)
MCV: 94.6 fL (ref 78.0–100.0)
Platelets: 314 10*3/uL (ref 150–400)
RBC: 3.71 MIL/uL — ABNORMAL LOW (ref 3.87–5.11)
RDW: 13.5 % (ref 11.5–15.5)
WBC: 4.4 10*3/uL (ref 4.0–10.5)

## 2014-07-15 LAB — POC URINE PREG, ED: Preg Test, Ur: NEGATIVE

## 2014-07-15 LAB — I-STAT TROPONIN, ED: Troponin i, poc: 0 ng/mL (ref 0.00–0.08)

## 2014-07-15 LAB — D-DIMER, QUANTITATIVE: D-Dimer, Quant: 0.35 ug/mL-FEU (ref 0.00–0.48)

## 2014-07-15 MED ORDER — KETOROLAC TROMETHAMINE 30 MG/ML IJ SOLN
30.0000 mg | Freq: Once | INTRAMUSCULAR | Status: AC
  Administered 2014-07-15: 30 mg via INTRAVENOUS
  Filled 2014-07-15: qty 1

## 2014-07-15 MED ORDER — ASPIRIN 325 MG PO TABS
325.0000 mg | ORAL_TABLET | ORAL | Status: AC
  Administered 2014-07-15: 325 mg via ORAL
  Filled 2014-07-15: qty 1

## 2014-07-15 MED ORDER — SODIUM CHLORIDE 0.9 % IV SOLN
INTRAVENOUS | Status: DC
  Administered 2014-07-15: 14:00:00 via INTRAVENOUS

## 2014-07-15 MED ORDER — LORAZEPAM 2 MG/ML IJ SOLN
1.0000 mg | Freq: Once | INTRAMUSCULAR | Status: AC
  Administered 2014-07-15: 1 mg via INTRAVENOUS
  Filled 2014-07-15: qty 1

## 2014-07-15 MED ORDER — OXYCODONE-ACETAMINOPHEN 5-325 MG PO TABS
2.0000 | ORAL_TABLET | Freq: Once | ORAL | Status: AC
  Administered 2014-07-15: 2 via ORAL
  Filled 2014-07-15: qty 2

## 2014-07-15 MED ORDER — OXYCODONE-ACETAMINOPHEN 5-325 MG PO TABS: 1.0000 | ORAL_TABLET | ORAL | Status: DC | PRN

## 2014-07-15 NOTE — Discharge Instructions (Signed)

## 2014-07-15 NOTE — ED Provider Notes (Signed)
CSN: [id number]     Arrival date & time 07/15/14  0940 History   First MD Initiated Contact with Patient 07/15/14 1236     Chief Complaint  Patient presents with  . Chest Pain    started at work     (Consider location/radiation/quality/duration/timing/severity/associated sxs/prior Treatment) HPI Comments: Patient here planning of chest and abdominal pain worse with palpation that began this morning. Pain characterized as sharp and positional. No associated diaphoresis or nausea or vomiting. Does feel lightheaded. Denies any pleuritic component to it. No orthopnea. History of fibromyalgia which she usually has in her arms or legs. Symptoms persistent and no treatment use prior to arrival.  Patient is a 39 y.o. female presenting with chest pain. The history is provided by the patient.  Chest Pain   Past Medical History  Diagnosis Date  . Migraine   . Myalgia   . Anemia   . Myalgia and myositis, unspecified 11/28/2012  . Headache(784.0) 11/28/2012  . Obesity   . Iron deficiency anemia   . Depression   . Panic attack   . Fibromyalgia    Past Surgical History  Procedure Laterality Date  . Cesarean section    . Tubal ligation    . Carpal tunnel release Right    Family History  Problem Relation Age of Onset  . Depression Father   . Breast cancer Maternal Grandmother   . Arthritis/Rheumatoid Maternal Grandmother   . Lupus Paternal Grandmother    History  Substance Use Topics  . Smoking status: Never Smoker   . Smokeless tobacco: Never Used  . Alcohol Use: Yes     Comment: occasionally   OB History    No data available     Review of Systems  Cardiovascular: Positive for chest pain.  All other systems reviewed and are negative.     Allergies  Review of patient's allergies indicates no known allergies.  Home Medications   Prior to Admission medications   Medication Sig Start Date End Date Taking? Authorizing Provider  amitriptyline (ELAVIL) 25 MG tablet Take 50  mg by mouth at bedtime.     Historical Provider, MD  amoxicillin (AMOXIL) 500 MG capsule Take 1 capsule (500 mg total) by mouth 2 (two) times daily. 06/17/14   Charm RingsErin J Honig, MD  aspirin-acetaminophen-caffeine (EXCEDRIN MIGRAINE) [id number] MG per tablet Take 2 tablets by mouth every 6 (six) hours as needed for pain.    Historical Provider, MD  gabapentin (NEURONTIN) 100 MG capsule Take 100 mg by mouth at bedtime.    Historical Provider, MD  HYDROcodone-acetaminophen (NORCO/VICODIN) 5-325 MG per tablet Take 1 tablet by mouth daily as needed for moderate pain (pain). 1 to 2 tabs every 4 to 6 hours as needed for pain. 08/24/12   Reuben Likesavid C Keller, MD  ibuprofen (ADVIL,MOTRIN) 800 MG tablet Take 1 tablet (800 mg total) by mouth 3 (three) times daily. 04/14/14   Fayrene HelperBowie Tran, PA-C  iron polysaccharides (NIFEREX) 150 MG capsule Take 150 mg by mouth daily.    Historical Provider, MD  methocarbamol (ROBAXIN) 500 MG tablet Take 1 tablet (500 mg total) by mouth 2 (two) times daily. 04/14/14   Fayrene HelperBowie Tran, PA-C   BP 134/95 mmHg  Pulse 100  Temp(Src) 98 F (36.7 C) (Oral)  Resp 22  Ht 5\' 9"  (1.753 m)  Wt 228 lb (103.42 kg)  BMI 33.65 kg/m2  SpO2 100%  LMP 07/08/2014 Physical Exam  Constitutional: She is oriented to person, place, and time. She appears well-developed  and well-nourished.  Non-toxic appearance. No distress.  HENT:  Head: Normocephalic and atraumatic.  Eyes: Conjunctivae, EOM and lids are normal. Pupils are equal, round, and reactive to light.  Neck: Normal range of motion. Neck supple. No tracheal deviation present. No thyroid mass present.  Cardiovascular: Normal rate, regular rhythm and normal heart sounds.  Exam reveals no gallop.   No murmur heard. Pulmonary/Chest: Effort normal and breath sounds normal. No stridor. No respiratory distress. She has no decreased breath sounds. She has no wheezes. She has no rhonchi. She has no rales.    Abdominal: Soft. Normal appearance and bowel sounds are  normal. She exhibits no distension. There is no tenderness. There is no rebound and no CVA tenderness.  Musculoskeletal: Normal range of motion. She exhibits no edema or tenderness.  Neurological: She is alert and oriented to person, place, and time. She has normal strength. No cranial nerve deficit or sensory deficit. GCS eye subscore is 4. GCS verbal subscore is 5. GCS motor subscore is 6.  Skin: Skin is warm and dry. No abrasion and no rash noted.  Psychiatric: She has a normal mood and affect. Her speech is normal and behavior is normal.  Nursing note and vitals reviewed.   ED Course  Procedures (including critical care time) Labs Review Labs Reviewed  CBC - Abnormal; Notable for the following:    RBC 3.71 (*)    Hemoglobin 11.0 (*)    HCT 35.1 (*)    All other components within normal limits  BASIC METABOLIC PANEL - Abnormal; Notable for the following:    Glucose, Bld 115 (*)    GFR calc non Af Amer 77 (*)    GFR calc Af Amer 89 (*)    All other components within normal limits  D-DIMER, QUANTITATIVE  I-STAT TROPOININ, ED  POC URINE PREG, ED    Imaging Review No results found.   EKG Interpretation   Date/Time:  Tuesday July 15 2014 09:48:03 EDT Ventricular Rate:  94 PR Interval:  127 QRS Duration: 78 QT Interval:  368 QTC Calculation: 460 R Axis:   47 Text Interpretation:  Sinus rhythm Borderline T abnormalities, inferior  leads No significant change since last tracing Confirmed by Felicite Zeimet  MD,  Saturnino Liew ([id number]) on 07/15/2014 12:40:46 PM      MDM   Final diagnoses:  Chest pain     Patient given medications for chest wall pain here and feels better. Patient has a known history of fibromyalgia and believe that is what is etiology of this. Troponin negative d-dimer negative. Will follow-up with her Dr. Sharen Hones week   Lorre Nick, MD 07/15/14 [phone]

## 2014-07-15 NOTE — ED Notes (Signed)
Patient states she was walking into work when she began having chest pain. Patient tearful and hyperventilating in triage. Patient states is lightheaded as well. Patient reports a prior similar experience, although "not as bad" when she had a panic attack in the past.

## 2014-07-17 ENCOUNTER — Ambulatory Visit (INDEPENDENT_AMBULATORY_CARE_PROVIDER_SITE_OTHER): Payer: 59

## 2014-07-17 DIAGNOSIS — R930 Abnormal findings on diagnostic imaging of skull and head, not elsewhere classified: Secondary | ICD-10-CM | POA: Diagnosis not present

## 2014-07-17 DIAGNOSIS — R9082 White matter disease, unspecified: Secondary | ICD-10-CM

## 2014-07-19 ENCOUNTER — Telehealth: Payer: Self-pay | Admitting: Neurology

## 2014-07-19 NOTE — Telephone Encounter (Signed)
I called the patient. The MRI of the brain show minimal changes in the posterior WM. The prior MRI scan was done at [facility], we will need to get the disc from that scan to compare this study. I discussed this with the patient.   MRI  Brain 07/18/14:  IMPRESSION: Abnormal MRI scan of brain showing nonspecific left parietal periventricular and bilateral subcortical white matter hyperintensities who with the differential stated above. Direct comparison with prior films when available will be beneficial to comment on interval change.

## 2014-07-21 ENCOUNTER — Telehealth: Payer: Self-pay | Admitting: Neurology

## 2014-07-21 NOTE — Telephone Encounter (Signed)
I have compared the most recent MRI the brain with the one done at [facility] previously, the studies appear to be essentially unchanged. The prior study done was on 12/06/2012.

## 2014-09-07 ENCOUNTER — Ambulatory Visit (HOSPITAL_BASED_OUTPATIENT_CLINIC_OR_DEPARTMENT_OTHER): Payer: No Typology Code available for payment source | Attending: Specialist

## 2014-09-16 ENCOUNTER — Encounter (HOSPITAL_COMMUNITY): Payer: Self-pay | Admitting: Emergency Medicine

## 2014-09-16 ENCOUNTER — Emergency Department (HOSPITAL_COMMUNITY)
Admission: EM | Admit: 2014-09-16 | Discharge: 2014-09-16 | Disposition: A | Discharge status: Home / Self Care | Payer: Self-pay | Source: Home / Self Care | Attending: Family Medicine | Admitting: Family Medicine

## 2014-09-16 ENCOUNTER — Emergency Department (INDEPENDENT_AMBULATORY_CARE_PROVIDER_SITE_OTHER): Payer: 59

## 2014-09-16 DIAGNOSIS — M7711 Lateral epicondylitis, right elbow: Secondary | ICD-10-CM

## 2014-09-16 NOTE — Discharge Instructions (Signed)
Thank you for coming in today. Use the wrist brace. Do the exercises we discussed. Attend physical therapy Follow-up with me if not better   Lateral Epicondylitis (Tennis Elbow) with Rehab Lateral epicondylitis involves inflammation and pain around the outer portion of the elbow. The pain is caused by inflammation of the tendons in the forearm that bring back (extend) the wrist. Lateral epicondylitis is also called tennis elbow, because it is very common in tennis players. However, it may occur in any individual who extends the wrist repetitively. If lateral epicondylitis is left untreated, it may become a chronic problem. SYMPTOMS   Pain, tenderness, and inflammation on the outer (lateral) side of the elbow.  Pain or weakness with gripping activities.  Pain that increases with wrist-twisting motions (playing tennis, using a screwdriver, opening a door or a jar).  Pain with lifting objects, including a coffee cup. CAUSES  Lateral epicondylitis is caused by inflammation of the tendons that extend the wrist. Causes of injury may include:  Repetitive stress and strain on the muscles and tendons that extend the wrist.  Sudden change in activity level or intensity.  Incorrect grip in racquet sports.  Incorrect grip size of racquet (often too large).  Incorrect hitting position or technique (usually backhand, leading with the elbow).  Using a racket that is too heavy. RISK INCREASES WITH:  Sports or occupations that require repetitive and/or strenuous forearm and wrist movements (tennis, squash, racquetball, carpentry).  Poor wrist and forearm strength and flexibility.  Failure to warm up properly before activity.  Resuming activity before healing, rehabilitation, and conditioning are complete. PREVENTION   Warm up and stretch properly before activity.  Maintain physical fitness:  Strength, flexibility, and endurance.  Cardiovascular fitness.  Wear and use properly  fitted equipment.  Learn and use proper technique and have a coach correct improper technique.  Wear a tennis elbow (counterforce) brace. PROGNOSIS  The course of this condition depends on the degree of the injury. If treated properly, acute cases (symptoms lasting less than 4 weeks) are often resolved in 2 to 6 weeks. Chronic (longer lasting cases) often resolve in 3 to 6 months but may require physical therapy. RELATED COMPLICATIONS   Frequently recurring symptoms, resulting in a chronic problem. Properly treating the problem the first time decreases frequency of recurrence.  Chronic inflammation, scarring tendon degeneration, and partial tendon tear, requiring surgery.  Delayed healing or resolution of symptoms. TREATMENT  Treatment first involves the use of ice and medicine to reduce pain and inflammation. Strengthening and stretching exercises may help reduce discomfort if performed regularly. These exercises may be performed at home if the condition is an acute injury. Chronic cases may require a referral to a physical therapist for evaluation and treatment. Your caregiver may advise a corticosteroid injection to help reduce inflammation. Rarely, surgery is needed. MEDICATION  If pain medicine is needed, nonsteroidal anti-inflammatory medicines (aspirin and ibuprofen), or other minor pain relievers (acetaminophen), are often advised.  Do not take pain medicine for 7 days before surgery.  Prescription pain relievers may be given, if your caregiver thinks they are needed. Use only as directed and only as much as you need.  Corticosteroid injections may be recommended. These injections should be reserved only for the most severe cases, because they can only be given a certain number of times. HEAT AND COLD  Cold treatment (icing) should be applied for 10 to 15 minutes every 2 to 3 hours for inflammation and pain, and immediately after activity that  aggravates your symptoms. Use ice  packs or an ice massage.  Heat treatment may be used before performing stretching and strengthening activities prescribed by your caregiver, physical therapist, or athletic trainer. Use a heat pack or a warm water soak. SEEK MEDICAL CARE IF: Symptoms get worse or do not improve in 2 weeks, despite treatment. EXERCISES  RANGE OF MOTION (ROM) AND STRETCHING EXERCISES - Epicondylitis, Lateral (Tennis Elbow) These exercises may help you when beginning to rehabilitate your injury. Your symptoms may go away with or without further involvement from your physician, physical therapist, or athletic trainer. While completing these exercises, remember:   Restoring tissue flexibility helps normal motion to return to the joints. This allows healthier, less painful movement and activity.  An effective stretch should be held for at least 30 seconds.  A stretch should never be painful. You should only feel a gentle lengthening or release in the stretched tissue. RANGE OF MOTION - Wrist Flexion, Active-Assisted  Extend your right / left elbow with your fingers pointing down.*  Gently pull the back of your hand towards you, until you feel a gentle stretch on the top of your forearm.  Hold this position for __________ seconds. Repeat __________ times. Complete this exercise __________ times per day.  *If directed by your physician, physical therapist or athletic trainer, complete this stretch with your elbow bent, rather than extended. RANGE OF MOTION - Wrist Extension, Active-Assisted  Extend your right / left elbow and turn your palm upwards.*  Gently pull your palm and fingertips back, so your wrist extends and your fingers point more toward the ground.  You should feel a gentle stretch on the inside of your forearm.  Hold this position for __________ seconds. Repeat __________ times. Complete this exercise __________ times per day. *If directed by your physician, physical therapist or athletic  trainer, complete this stretch with your elbow bent, rather than extended. STRETCH - Wrist Flexion  Place the back of your right / left hand on a tabletop, leaving your elbow slightly bent. Your fingers should point away from your body.  Gently press the back of your hand down onto the table by straightening your elbow. You should feel a stretch on the top of your forearm.  Hold this position for __________ seconds. Repeat __________ times. Complete this stretch __________ times per day.  STRETCH - Wrist Extension   Place your right / left fingertips on a tabletop, leaving your elbow slightly bent. Your fingers should point backwards.  Gently press your fingers and palm down onto the table by straightening your elbow. You should feel a stretch on the inside of your forearm.  Hold this position for __________ seconds. Repeat __________ times. Complete this stretch __________ times per day.  STRENGTHENING EXERCISES - Epicondylitis, Lateral (Tennis Elbow) These exercises may help you when beginning to rehabilitate your injury. They may resolve your symptoms with or without further involvement from your physician, physical therapist, or athletic trainer. While completing these exercises, remember:   Muscles can gain both the endurance and the strength needed for everyday activities through controlled exercises.  Complete these exercises as instructed by your physician, physical therapist or athletic trainer. Increase the resistance and repetitions only as guided.  You may experience muscle soreness or fatigue, but the pain or discomfort you are trying to eliminate should never worsen during these exercises. If this pain does get worse, stop and make sure you are following the directions exactly. If the pain is still present after adjustments,  discontinue the exercise until you can discuss the trouble with your caregiver. STRENGTH - Wrist Flexors  Sit with your right / left forearm palm-up and  fully supported on a table or countertop. Your elbow should be resting below the height of your shoulder. Allow your wrist to extend over the edge of the surface.  Loosely holding a __________ weight, or a piece of rubber exercise band or tubing, slowly curl your hand up toward your forearm.  Hold this position for __________ seconds. Slowly lower the wrist back to the starting position in a controlled manner. Repeat __________ times. Complete this exercise __________ times per day.  STRENGTH - Wrist Extensors  Sit with your right / left forearm palm-down and fully supported on a table or countertop. Your elbow should be resting below the height of your shoulder. Allow your wrist to extend over the edge of the surface.  Loosely holding a __________ weight, or a piece of rubber exercise band or tubing, slowly curl your hand up toward your forearm.  Hold this position for __________ seconds. Slowly lower the wrist back to the starting position in a controlled manner. Repeat __________ times. Complete this exercise __________ times per day.  STRENGTH - Ulnar Deviators  Stand with a ____________________ weight in your right / left hand, or sit while holding a rubber exercise band or tubing, with your healthy arm supported on a table or countertop.  Move your wrist, so that your pinkie travels toward your forearm and your thumb moves away from your forearm.  Hold this position for __________ seconds and then slowly lower the wrist back to the starting position. Repeat __________ times. Complete this exercise __________ times per day STRENGTH - Radial Deviators  Stand with a ____________________ weight in your right / left hand, or sit while holding a rubber exercise band or tubing, with your injured arm supported on a table or countertop.  Raise your hand upward in front of you or pull up on the rubber tubing.  Hold this position for __________ seconds and then slowly lower the wrist back to  the starting position. Repeat __________ times. Complete this exercise __________ times per day. STRENGTH - Forearm Supinators   Sit with your right / left forearm supported on a table, keeping your elbow below shoulder height. Rest your hand over the edge, palm down.  Gently grip a hammer or a soup ladle.  Without moving your elbow, slowly turn your palm and hand upward to a "thumbs-up" position.  Hold this position for __________ seconds. Slowly return to the starting position. Repeat __________ times. Complete this exercise __________ times per day.  STRENGTH - Forearm Pronators   Sit with your right / left forearm supported on a table, keeping your elbow below shoulder height. Rest your hand over the edge, palm up.  Gently grip a hammer or a soup ladle.  Without moving your elbow, slowly turn your palm and hand upward to a "thumbs-up" position.  Hold this position for __________ seconds. Slowly return to the starting position. Repeat __________ times. Complete this exercise __________ times per day.  STRENGTH - Grip  Grasp a tennis ball, a dense sponge, or a large, rolled sock in your hand.  Squeeze as hard as you can, without increasing any pain.  Hold this position for __________ seconds. Release your grip slowly. Repeat __________ times. Complete this exercise __________ times per day.  STRENGTH - Elbow Extensors, Isometric  Stand or sit upright, on a firm surface. Place your  right / left arm so that your palm faces your stomach, and it is at the height of your waist.  Place your opposite hand on the underside of your forearm. Gently push up as your right / left arm resists. Push as hard as you can with both arms, without causing any pain or movement at your right / left elbow. Hold this stationary position for __________ seconds. Gradually release the tension in both arms. Allow your muscles to relax completely before repeating. Document Released: 03/07/2005 Document  Revised: 07/22/2013 Document Reviewed: 06/19/2008 [facility] Patient Information 2015 [place], [place]. This information is not intended to replace advice given to you by your health care provider. Make sure you discuss any questions you have with your health care provider.

## 2014-09-16 NOTE — ED Provider Notes (Signed)
Julie Schwartz is a 39 y.o. female who presents to Urgent Care today for right elbow pain. Patient has had right elbow pain for the past 3 weeks. The pain worsened recently. Patient cannot recall any specific injury however he she notes the pain started after she did a collision-type activity with several falls. She notes pain is located throughout her entire elbow but seems to be worse on the lateral side. Pain is worse with wrist extension and typing. No radiating pain weakness or numbness. She has tried BenGay and Aleve which helped a little.   Past Medical History  Diagnosis Date  . Migraine   . Myalgia   . Anemia   . Myalgia and myositis, unspecified 11/28/2012  . Headache(784.0) 11/28/2012  . Obesity   . Iron deficiency anemia   . Depression   . Panic attack   . Fibromyalgia    Past Surgical History  Procedure Laterality Date  . Cesarean section    . Tubal ligation    . Carpal tunnel release Right    History  Substance Use Topics  . Smoking status: Never Smoker   . Smokeless tobacco: Never Used  . Alcohol Use: Yes     Comment: occasionally   ROS as above Medications: No current facility-administered medications for this encounter.   Current Outpatient Prescriptions  Medication Sig Dispense Refill  . amitriptyline (ELAVIL) 25 MG tablet Take 50 mg by mouth at bedtime.     Marland Kitchen. aspirin-acetaminophen-caffeine (EXCEDRIN MIGRAINE) 250-250-65 MG per tablet Take 2 tablets by mouth every 6 (six) hours as needed for pain.    Marland Kitchen. gabapentin (NEURONTIN) 100 MG capsule Take 100 mg by mouth at bedtime.    . iron polysaccharides (NIFEREX) 150 MG capsule Take 150 mg by mouth daily.    Marland Kitchen. topiramate (TOPAMAX) 25 MG tablet Take 25 mg by mouth 2 (two) times daily as needed (migraine).     No Known Allergies   Exam:  BP 123/91 mmHg  Pulse 67  Temp(Src) 99.1 F (37.3 C) (Oral)  Resp 16  SpO2 100%  LMP 08/24/2014 Gen: Well NAD HEENT: EOMI,  MMM Lungs: Normal work of breathing.  CTABL Heart: RRR no MRG Abd: NABS, Soft. Nondistended, Nontender Exts: Brisk capillary refill, warm and well perfused.  Right elbow normal-appearing no swelling. Normal range of motion but with some pain. Tender palpation maximally at the lateral upper condyle and minimally at the medial upper condyle. Pain is present and worse with wrist extension. Pulses capillary refill and sensation are intact distally bilateral wrists.  No results found for this or any previous visit (from the past 24 hour(s)). Dg Elbow Complete Right  09/16/2014   CLINICAL DATA:  Right elbow injury playing sports several weeks ago. posterolateral elbow pain.  EXAM: RIGHT ELBOW - COMPLETE 3+ VIEW  COMPARISON:  None.  FINDINGS: There is no evidence of fracture, dislocation, or joint effusion. There is no evidence of arthropathy or other focal bone abnormality. Soft tissues are unremarkable.  IMPRESSION: 1. No significant abnormality identified. If pain persists despite conservative therapy, MRI may be warranted for further characterization.   Electronically Signed   By: Gaylyn RongWalter  Liebkemann M.D.   On: 09/16/2014 19:28    Assessment and Plan: 39 y.o. female with probable right lateral epicondylitis. Trial of physical therapy with iontophoresis NSAIDs wrist bracing. Follow up with sports medicine if not better.  Discussed warning signs or symptoms. Please see discharge instructions. Patient expresses understanding.     Rodolph BongEvan S Voncile Schwarz, MD  09/16/14 2013 

## 2014-09-16 NOTE — ED Notes (Signed)
Right elbow pain, onset 6/19

## 2015-02-03 ENCOUNTER — Emergency Department (INDEPENDENT_AMBULATORY_CARE_PROVIDER_SITE_OTHER)
Admission: EM | Admit: 2015-02-03 | Discharge: 2015-02-03 | Disposition: A | Discharge status: Home / Self Care | Payer: Managed Care, Other (non HMO) | Source: Home / Self Care | Attending: Emergency Medicine | Admitting: Emergency Medicine

## 2015-02-03 ENCOUNTER — Encounter (HOSPITAL_COMMUNITY): Payer: Self-pay | Admitting: Emergency Medicine

## 2015-02-03 DIAGNOSIS — M25511 Pain in right shoulder: Secondary | ICD-10-CM

## 2015-02-03 MED ORDER — LIDOCAINE HCL (PF) 2 % IJ SOLN
INTRAMUSCULAR | Status: AC
  Filled 2015-02-03: qty 2

## 2015-02-03 NOTE — ED Notes (Signed)
C/o right shoulder/back pain onset 2 weeks that has been constant Also reports numbness down the right arm A&O x4... No acute distress.

## 2015-02-03 NOTE — Discharge Instructions (Signed)
I think your pain is coming from a trigger point. We did an injection today. Please alternate ice and heat to the area for the next 2-3 days. Make sure your chair and desk are adjusted so you do not have tension in your shoulder when you are typing. If this is not improving by the end of the week, please follow-up with your primary care doctor.

## 2015-02-03 NOTE — ED Provider Notes (Signed)
CSN: [id number]     Arrival date & time 02/03/15  1346 History   First MD Initiated Contact with Patient 02/03/15 1432     Chief Complaint  Patient presents with  . Shoulder Pain   (Consider location/radiation/quality/duration/timing/severity/associated sxs/prior Treatment) HPI  She is a 39 year old woman here for evaluation of right shoulder pain. She states this started about 2 weeks ago and has gradually been getting worse. The pain is located at the inferior angle of the right scapula. It is a constant pressure and aching sensation. Yesterday, she developed some shooting pains into her right arm. It'll make her arm feel numb. She states her right arm feels weak, but is unsure if it is due to pain. Nothing seems to make the pain better or worse. She has tried massage which is quite uncomfortable. No pain with overhead movements. No shortness of breath or pain with deep breaths. No leg swelling. No recent car trips or immobilization. She is not on any estrogen supplements. She denies any injury or trauma. No recent falls or car accidents. 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    Past Medical History  Diagnosis Date  . Migraine   . Myalgia   . Anemia   . Myalgia and myositis, unspecified 11/28/2012  . Headache(784.0) 11/28/2012  . Obesity   . Iron deficiency anemia   . Depression   . Panic attack   . Fibromyalgia    Past Surgical History  Procedure Laterality Date  . Cesarean section    . Tubal ligation    . Carpal tunnel release Right    Family History  Problem Relation Age of Onset  . Depression Father   . Breast cancer Maternal Grandmother   . Arthritis/Rheumatoid Maternal Grandmother   . Lupus Paternal Grandmother    Social History  Substance Use Topics  . Smoking status: Never Smoker   . Smokeless tobacco: Never Used  . Alcohol Use: Yes     Comment: occasionally   OB History    No data available     Review of Systems As  in history of present illness Allergies  Review of patient's allergies indicates no known allergies.  Home Medications   Prior to Admission medications   Medication Sig Start Date End Date Taking? Authorizing Provider  gabapentin (NEURONTIN) 100 MG capsule Take 100 mg by mouth at bedtime.   Yes Historical Provider, MD  topiramate (TOPAMAX) 25 MG tablet Take 25 mg by mouth 2 (two) times daily as needed (migraine).   Yes Historical Provider, MD  amitriptyline (ELAVIL) 25 MG tablet Take 50 mg by mouth at bedtime.     Historical Provider, MD  aspirin-acetaminophen-caffeine (EXCEDRIN MIGRAINE) [phone] MG per tablet Take 2 tablets by mouth every 6 (six) hours as needed for pain.    Historical Provider, MD  iron polysaccharides (NIFEREX) 150 MG capsule Take 150 mg by mouth daily.    Historical Provider, MD   Meds Ordered and Administered this Visit  Medications - No data to display  BP 135/77 mmHg  Pulse 76  Temp(Src) 98.1 F (36.7 C) (Oral)  Resp 16  SpO2 100%  LMP 12/31/2014 No data found.   Physical Exam  Constitutional: She is oriented to person, place, and time. She appears well-developed and well-nourished. No distress.  Neck: Neck supple.  Cardiovascular: Normal rate.   Pulmonary/Chest: Effort normal and breath sounds normal. No respiratory distress. She has no wheezes. She  has no rales.  Musculoskeletal:  Right upper back: She has point tenderness at the inferior angle of the scapula. There is an associated muscle nodule bear. She has 5 out of 5 strength in bilateral upper extremities. 2+ radial pulse bilaterally.  Neurological: She is alert and oriented to person, place, and time.    ED Course  Procedures (including critical care time)  Trigger point was identified and marked. Skin was cleaned with alcohol. 4 mL of 2% lidocaine without epinephrine was injected in a wheel distribution. Patient tolerated procedure well with no immediate complications.  Labs Review Labs  Reviewed - No data to display  Imaging Review No results found.   MDM   1. Trigger point of right shoulder region    Trigger point injection done today. Recommended alternating ice and heat over the next few days. Massage as tolerated. Recommended ibuprofen 400 mg every 4-6 hours as needed. Discussed adjusting her desk and chair so there is no tension in her shoulder when typing. Follow up with PCP if not improving.    Charm RingsErin J Sabirin Baray, MD 02/03/15 [phone]

## 2015-05-12 ENCOUNTER — Emergency Department (INDEPENDENT_AMBULATORY_CARE_PROVIDER_SITE_OTHER)
Admission: EM | Admit: 2015-05-12 | Discharge: 2015-05-12 | Disposition: A | Discharge status: Home / Self Care | Payer: Managed Care, Other (non HMO) | Source: Home / Self Care | Attending: Emergency Medicine | Admitting: Emergency Medicine

## 2015-05-12 ENCOUNTER — Encounter (HOSPITAL_COMMUNITY): Payer: Self-pay | Admitting: Emergency Medicine

## 2015-05-12 ENCOUNTER — Emergency Department (INDEPENDENT_AMBULATORY_CARE_PROVIDER_SITE_OTHER): Payer: Managed Care, Other (non HMO)

## 2015-05-12 DIAGNOSIS — K5900 Constipation, unspecified: Secondary | ICD-10-CM

## 2015-05-12 DIAGNOSIS — R1032 Left lower quadrant pain: Secondary | ICD-10-CM

## 2015-05-12 LAB — POCT URINALYSIS DIP (DEVICE)
Bilirubin Urine: NEGATIVE
Glucose, UA: NEGATIVE mg/dL
Hgb urine dipstick: NEGATIVE
Ketones, ur: NEGATIVE mg/dL
Leukocytes, UA: NEGATIVE
Nitrite: NEGATIVE
Protein, ur: NEGATIVE mg/dL
Specific Gravity, Urine: 1.02 (ref 1.005–1.030)
Urobilinogen, UA: 1 mg/dL (ref 0.0–1.0)
pH: 7 (ref 5.0–8.0)

## 2015-05-12 LAB — POCT PREGNANCY, URINE: Preg Test, Ur: NEGATIVE

## 2015-05-12 MED ORDER — IBUPROFEN 800 MG PO TABS: 800.0000 mg | ORAL_TABLET | Freq: Three times a day (TID) | ORAL | Status: DC

## 2015-05-12 MED ORDER — POLYETHYLENE GLYCOL 3350 17 GM/SCOOP PO POWD: 17.0000 g | Freq: Every day | ORAL | Status: DC

## 2015-05-12 NOTE — Discharge Instructions (Signed)
You may take one capful of MiraLAX every hour until you see results. Otherwise you can do it once or twice a day. You will see results in maximum 3 days. Try some magnesium citrate and an enema. Do apple juice, increase fluids at least 2 L of water a day.

## 2015-05-12 NOTE — ED Provider Notes (Signed)
HPI  SUBJECTIVE:  Julie Schwartz is a 40 y.o. female who presents with abdominal pain described as constant, squeezing, crampy pressure with intermittent, shooting left flank pain with no migration, radiation for 3 days. She had one episode of emesis today but is generally tolerating by mouth. She states she has not had a bowel movement in 1 week. She is passing gas. She states that she feels urge to defecate, but cannot pass stool even with straining. Symptoms are better with moving around, leaning to the right, worse with trying to stool, sitting still, palpation. She has not tried anything for this. Admits to not drinking much water. Patient does not take any medications on a regular basis. No fevers, CP, SOB, cough, anorexia, diarrhea, melena, hematochezia. No abd distension, dysuria, urinary urgency, frequency, hematuria.  No back pain. No syncope. No vaginal bleeding, abnormal d/c. LMP 2/10. No ETOH abuse, pt is not a smoker. No excess NSAID use. No recent trauma to the abdomen. Past  medical history of C-section, hypertension, nephrolithiasis.. No h/o MI,  DM, CAD, afib, CHF. No h/o other abdominal surgeries, PUD, biliary disease, cirrhosis, pancreatitis, diverticulitis, diverticulosis, obstruction, GIB. No h/o obstructing nephrolithiasis, AAA. No h/o STD, PID, ovarian cysts. No h/o CA, DVT/PE, hypercoagulability, OCP use. No h/o UC, IBS, HIV   Past Medical History  Diagnosis Date  . Migraine   . Myalgia   . Anemia   . Myalgia and myositis, unspecified 11/28/2012  . Headache(784.0) 11/28/2012  . Obesity   . Iron deficiency anemia   . Depression   . Panic attack   . Fibromyalgia     Past Surgical History  Procedure Laterality Date  . Cesarean section    . Tubal ligation    . Carpal tunnel release Right     Family History  Problem Relation Age of Onset  . Depression Father   . Breast cancer Maternal Grandmother   . Arthritis/Rheumatoid Maternal Grandmother   . Lupus Paternal  Grandmother     Social History  Substance Use Topics  . Smoking status: Never Smoker   . Smokeless tobacco: Never Used  . Alcohol Use: Yes     Comment: occasionally    No current facility-administered medications for this encounter.  Current outpatient prescriptions:  .  amitriptyline (ELAVIL) 25 MG tablet, Take 50 mg by mouth at bedtime. , Disp: , Rfl:  .  gabapentin (NEURONTIN) 100 MG capsule, Take 100 mg by mouth at bedtime., Disp: , Rfl:  .  ibuprofen (ADVIL,MOTRIN) 800 MG tablet, Take 1 tablet (800 mg total) by mouth 3 (three) times daily., Disp: 30 tablet, Rfl: 0 .  iron polysaccharides (NIFEREX) 150 MG capsule, Take 150 mg by mouth daily., Disp: , Rfl:  .  polyethylene glycol powder (GLYCOLAX/MIRALAX) powder, Take 17 g by mouth daily., Disp: 255 g, Rfl: 0 .  topiramate (TOPAMAX) 25 MG tablet, Take 25 mg by mouth 2 (two) times daily as needed (migraine)., Disp: , Rfl:   No Known Allergies   ROS  As noted in HPI.   Physical Exam  BP 170/89 mmHg  Pulse 89  Temp(Src) 97.9 F (36.6 C) (Tympanic)  Resp 16  SpO2 98%  LMP 05/01/2015 (Exact Date)  Constitutional: Well developed, well nourished, no acute distress Eyes:  EOMI, conjunctiva normal bilaterally HENT: Normocephalic, atraumatic,mucus membranes moist Respiratory: Normal inspiratory effort lungs clear bilaterally Cardiovascular: Normal rate regular rhythm, no murmurs, rubs, gallops  GI: Normal appearance, transverse C-section scar lower abdomen, soft, diffuse tenderness maximal left  flank and left lower quadrant, nondistended. Normal bowel sounds. Rectal: Hard stool in vault. Hemoccult negative. Back: No CVA tenderness skin: No rash, skin intact Musculoskeletal: no deformities Neurologic: Alert & oriented x 3, no focal neuro deficits Psychiatric: Speech and behavior appropriate   ED Course   Medications - No data to display  Orders Placed This Encounter  Procedures  . DG Abd Acute W/Chest    Standing  Status: Standing     Number of Occurrences: 1     Standing Expiration Date:     Order Specific Question:  Reason for Exam (SYMPTOM  OR DIAGNOSIS REQUIRED)    Answer:  abd pain, constipation r/o obstruction perforation  . POCT urinalysis dip (device)    Standing Status: Standing     Number of Occurrences: 1     Standing Expiration Date:   . Pregnancy, urine POC    Standing Status: Standing     Number of Occurrences: 1     Standing Expiration Date:     No results found for this or any previous visit (from the past 24 hour(s)). Dg Abd Acute W/chest  05/12/2015  CLINICAL DATA:  Left lower quadrant abdominal pain for 4 days. Constipation. Her ladder obstruction or perforation. Nausea today. EXAM: DG ABDOMEN ACUTE W/ 1V CHEST COMPARISON:  Abdominal CT 02/15/2014 FINDINGS: The cardiomediastinal contours are normal. The lungs are clear. There is no free intra-abdominal air. No dilated bowel loops to suggest obstruction. Moderate stool in the right and splenic flexure of the colon, with small volume of stool in the descending colon. No radiopaque calculi. Phlebolith in the left pelvis. No acute osseous abnormalities are seen. IMPRESSION: 1. Normal bowel gas pattern.  Small to moderate stool burden. 2. Clear lungs. Electronically Signed   By: Rubye Oaks M.D.   On: 05/12/2015 18:46    ED Clinical Impression  Left lower quadrant pain  Constipation, unspecified constipation type  ED Assessment/Plan  Reviewed labs, imaging independently. Small moderate stool burden. No obstruction No intra-abdominal air. See radiology report for full details.  Patient does not have UTI, hematuria. She is not pregnant. No evidence of perforation. Doubt diverticulitis, diverticulosis at this time. No evidence of surgical abdomen at this time. Presentation most consistent with constipation, home with MiraLAX, magnesium citrate, ibuprofen, follow-up with Dr. Eula Listen as needed.  Discussed labs, imaging, MDM, plan  and followup with patient . Discussed sn/sx that should prompt return to the  ED. Patient  agrees with plan.   *This clinic note was created using Dragon dictation software. Therefore, there may be occasional mistakes despite careful proofreading.  ?   Domenick Gong, MD 05/14/15 [phone]

## 2015-05-12 NOTE — ED Notes (Signed)
The patient presented to the [facility] with a complaint of upper left abdominal pain that has been ongoing for 3 days. The patient stated the pain makes her nauseous and that she has not had a bowel movement in 3 days. She stated that it hurts when she tries to go to the bathroom for a bowel movement and she is unable to go.

## 2015-06-05 ENCOUNTER — Emergency Department (HOSPITAL_COMMUNITY): Payer: Managed Care, Other (non HMO)

## 2015-06-05 ENCOUNTER — Encounter (HOSPITAL_COMMUNITY): Payer: Self-pay | Admitting: Emergency Medicine

## 2015-06-05 ENCOUNTER — Emergency Department (HOSPITAL_COMMUNITY)
Admission: EM | Admit: 2015-06-05 | Discharge: 2015-06-06 | Disposition: A | Discharge status: Home / Self Care | Payer: Managed Care, Other (non HMO) | Attending: Emergency Medicine | Admitting: Emergency Medicine

## 2015-06-05 DIAGNOSIS — R109 Unspecified abdominal pain: Secondary | ICD-10-CM

## 2015-06-05 DIAGNOSIS — K5900 Constipation, unspecified: Secondary | ICD-10-CM | POA: Diagnosis not present

## 2015-06-05 DIAGNOSIS — E669 Obesity, unspecified: Secondary | ICD-10-CM | POA: Diagnosis not present

## 2015-06-05 DIAGNOSIS — R141 Gas pain: Secondary | ICD-10-CM

## 2015-06-05 DIAGNOSIS — Z791 Long term (current) use of non-steroidal anti-inflammatories (NSAID): Secondary | ICD-10-CM | POA: Diagnosis not present

## 2015-06-05 DIAGNOSIS — G43909 Migraine, unspecified, not intractable, without status migrainosus: Secondary | ICD-10-CM | POA: Insufficient documentation

## 2015-06-05 DIAGNOSIS — F329 Major depressive disorder, single episode, unspecified: Secondary | ICD-10-CM | POA: Insufficient documentation

## 2015-06-05 DIAGNOSIS — Z79899 Other long term (current) drug therapy: Secondary | ICD-10-CM | POA: Diagnosis not present

## 2015-06-05 DIAGNOSIS — D509 Iron deficiency anemia, unspecified: Secondary | ICD-10-CM | POA: Insufficient documentation

## 2015-06-05 DIAGNOSIS — M797 Fibromyalgia: Secondary | ICD-10-CM | POA: Diagnosis not present

## 2015-06-05 DIAGNOSIS — R1032 Left lower quadrant pain: Secondary | ICD-10-CM

## 2015-06-05 DIAGNOSIS — Z3202 Encounter for pregnancy test, result negative: Secondary | ICD-10-CM | POA: Insufficient documentation

## 2015-06-05 DIAGNOSIS — R509 Fever, unspecified: Secondary | ICD-10-CM | POA: Insufficient documentation

## 2015-06-05 DIAGNOSIS — N179 Acute kidney failure, unspecified: Secondary | ICD-10-CM | POA: Diagnosis not present

## 2015-06-05 DIAGNOSIS — Z9851 Tubal ligation status: Secondary | ICD-10-CM | POA: Insufficient documentation

## 2015-06-05 DIAGNOSIS — F410 Panic disorder [episodic paroxysmal anxiety] without agoraphobia: Secondary | ICD-10-CM | POA: Diagnosis not present

## 2015-06-05 DIAGNOSIS — D649 Anemia, unspecified: Secondary | ICD-10-CM

## 2015-06-05 LAB — LIPASE, BLOOD: Lipase: 44 U/L (ref 11–51)

## 2015-06-05 LAB — URINALYSIS, ROUTINE W REFLEX MICROSCOPIC
Bilirubin Urine: NEGATIVE
Glucose, UA: NEGATIVE mg/dL
Hgb urine dipstick: NEGATIVE
Ketones, ur: NEGATIVE mg/dL
Leukocytes, UA: NEGATIVE
Nitrite: NEGATIVE
Protein, ur: NEGATIVE mg/dL
Specific Gravity, Urine: 1.021 (ref 1.005–1.030)
pH: 5 (ref 5.0–8.0)

## 2015-06-05 LAB — COMPREHENSIVE METABOLIC PANEL
ALT: 23 U/L (ref 14–54)
AST: 28 U/L (ref 15–41)
Albumin: 4 g/dL (ref 3.5–5.0)
Alkaline Phosphatase: 47 U/L (ref 38–126)
Anion gap: 11 (ref 5–15)
BUN: 11 mg/dL (ref 6–20)
CO2: 25 mmol/L (ref 22–32)
Calcium: 9.5 mg/dL (ref 8.9–10.3)
Chloride: 104 mmol/L (ref 101–111)
Creatinine, Ser: 1.06 mg/dL — ABNORMAL HIGH (ref 0.44–1.00)
GFR calc Af Amer: >60 mL/min (ref >60)
GFR calc non Af Amer: >60 mL/min (ref >60)
Glucose, Bld: 93 mg/dL (ref 65–99)
Potassium: 3.8 mmol/L (ref 3.5–5.1)
Sodium: 140 mmol/L (ref 135–145)
Total Bilirubin: 0.6 mg/dL (ref 0.3–1.2)
Total Protein: 7.7 g/dL (ref 6.5–8.1)

## 2015-06-05 LAB — CBC
HCT: 34.9 % — ABNORMAL LOW (ref 36.0–46.0)
Hemoglobin: 10.8 g/dL — ABNORMAL LOW (ref 12.0–15.0)
MCH: 28.5 pg (ref 26.0–34.0)
MCHC: 30.9 g/dL (ref 30.0–36.0)
MCV: 92.1 fL (ref 78.0–100.0)
Platelets: 295 10*3/uL (ref 150–400)
RBC: 3.79 MIL/uL — ABNORMAL LOW (ref 3.87–5.11)
RDW: 13.1 % (ref 11.5–15.5)
WBC: 7.5 10*3/uL (ref 4.0–10.5)

## 2015-06-05 LAB — POC URINE PREG, ED: Preg Test, Ur: NEGATIVE

## 2015-06-05 NOTE — ED Provider Notes (Signed)
CSN: [id number]     Arrival date & time 06/05/15  2017 History   First MD Initiated Contact with Patient 06/05/15 2202     Chief Complaint  Patient presents with  . Abdominal Pain  . Flank Pain     (Consider location/radiation/quality/duration/timing/severity/associated sxs/prior Treatment) HPI Comments: Julie Schwartz is a 40 y.o. female with a PMHx of migraines, anemia, obesity, myalgias, chronic headaches, depression, fibromyalgia, and anxiety, and a PSHx of tubal ligation and C-section, who presents to the ED with complaints of gradual onset left lateral and lower abdominal pain that began last night. She describes the pain is 10/10 constant dull and aching in the left lateral and lower abdomen, radiating into the flank, with intermittent sharp pains, worse with movement, with no treatments tried prior to arrival. She states this is somewhat similar to last month when she was diagnosed with constipation, but today symptoms are more constant than the symptoms she had then. She does endorse that she had a fever of 102.0 at work prior to arrival, she did not take anything for and she was afebrile here. She has had multiple sick contacts at work with the flu, no known GI illness sick contacts.  She denies any cough, URI symptoms, rhinorrhea, sore throat, chest pain, shortness of breath, nausea, vomiting, diarrhea, constipation, melena, hematochezia, obstipation, dysuria, hematuria, increased urinary frequency or urgency, vaginal bleeding or discharge, numbness, tingling, weakness, recent travel, suspicious food intake, alcohol use, NSAIDs, or prior abdominal surgeries aside from her C-section and BTL. LMP 05/25/15. +Sexually active with 1 female partner in the last 19yr, unprotected. Last BM was yesterday, and was normal for her, but she admits that she doesn't have daily BMs. Getting married in August, so has been having increased stress as well as changing diet to help lose weight.  Patient is a 40  y.o. female presenting with abdominal pain and flank pain. The history is provided by the patient and medical records. No language interpreter was used.  Abdominal Pain Pain location:  LLQ Pain quality: aching, dull and sharp   Pain radiates to:  L flank Pain severity:  Severe Onset quality:  Gradual Duration:  1 day Timing:  Constant Progression:  Waxing and waning Chronicity:  Recurrent Context: sick contacts (flu at work, but no other sick contacts with GI illness)   Context: not recent travel and not suspicious food intake   Relieved by:  None tried Worsened by:  Movement Ineffective treatments:  None tried Associated symptoms: fever (102.0 at work today, none since, with no tx tried)   Associated symptoms: no chest pain, no chills, no constipation, no cough, no diarrhea, no dysuria, no flatus, no hematemesis, no hematochezia, no hematuria, no melena, no nausea, no shortness of breath, no sore throat, no vaginal bleeding, no vaginal discharge and no vomiting   Risk factors: no alcohol abuse, has not had multiple surgeries and no NSAID use   Flank Pain Associated symptoms include abdominal pain and a fever (102.0 at work today, none since, with no tx tried). Pertinent negatives include no arthralgias, chest pain, chills, coughing, myalgias, nausea, numbness, sore throat, vomiting or weakness.    Past Medical History  Diagnosis Date  . Migraine   . Myalgia   . Anemia   . Myalgia and myositis, unspecified 11/28/2012  . Headache(784.0) 11/28/2012  . Obesity   . Iron deficiency anemia   . Depression   . Panic attack   . Fibromyalgia    Past Surgical History  Procedure Laterality Date  . Cesarean section    . Tubal ligation    . Carpal tunnel release Right    Family History  Problem Relation Age of Onset  . Depression Father   . Breast cancer Maternal Grandmother   . Arthritis/Rheumatoid Maternal Grandmother   . Lupus Paternal Grandmother    Social History  Substance Use  Topics  . Smoking status: Never Smoker   . Smokeless tobacco: Never Used  . Alcohol Use: Yes     Comment: occasionally   OB History    No data available     Review of Systems  Constitutional: Positive for fever (102.0 at work today, none since, with no tx tried). Negative for chills.  HENT: Negative for rhinorrhea and sore throat.   Respiratory: Negative for cough and shortness of breath.   Cardiovascular: Negative for chest pain.  Gastrointestinal: Positive for abdominal pain. Negative for nausea, vomiting, diarrhea, constipation, blood in stool, melena, hematochezia, flatus and hematemesis.  Genitourinary: Positive for flank pain. Negative for dysuria, frequency, hematuria, vaginal bleeding and vaginal discharge.  Musculoskeletal: Negative for myalgias and arthralgias.  Skin: Negative for color change.  Allergic/Immunologic: Negative for immunocompromised state.  Neurological: Negative for weakness and numbness.  Psychiatric/Behavioral: Negative for confusion.   10 Systems reviewed and are negative for acute change except as noted in the HPI.    Allergies  Review of patient's allergies indicates no known allergies.  Home Medications   Prior to Admission medications   Medication Sig Start Date End Date Taking? Authorizing Provider  amitriptyline (ELAVIL) 25 MG tablet Take 50 mg by mouth at bedtime.     Historical Provider, MD  gabapentin (NEURONTIN) 100 MG capsule Take 100 mg by mouth at bedtime.    Historical Provider, MD  ibuprofen (ADVIL,MOTRIN) 800 MG tablet Take 1 tablet (800 mg total) by mouth 3 (three) times daily. 05/12/15   Domenick GongAshley Mortenson, MD  iron polysaccharides (NIFEREX) 150 MG capsule Take 150 mg by mouth daily.    Historical Provider, MD  polyethylene glycol powder (GLYCOLAX/MIRALAX) powder Take 17 g by mouth daily. 05/12/15   Domenick GongAshley Mortenson, MD  topiramate (TOPAMAX) 25 MG tablet Take 25 mg by mouth 2 (two) times daily as needed (migraine).    Historical  Provider, MD   Triage VS: BP 165/106 mmHg  Pulse 104  Temp(Src) 98.3 F (36.8 C) (Oral)  Resp 20  SpO2 94%  LMP 05/25/2015 Recheck VS: BP 134/76 mmHg  Pulse 75  Temp(Src) 98.3 F (36.8 C) (Oral)  Resp 20  SpO2 100%  LMP 05/25/2015  Physical Exam  Constitutional: She is oriented to person, place, and time. Vital signs are normal. She appears well-developed and well-nourished.  Non-toxic appearance. No distress.  Afebrile, nontoxic, NAD  HENT:  Head: Normocephalic and atraumatic.  Mouth/Throat: Oropharynx is clear and moist and mucous membranes are normal.  Eyes: Conjunctivae and EOM are normal. Right eye exhibits no discharge. Left eye exhibits no discharge.  Neck: Normal range of motion. Neck supple.  Cardiovascular: Normal rate, regular rhythm, normal heart sounds and intact distal pulses.  Exam reveals no gallop and no friction rub.   No murmur heard. Pulmonary/Chest: Effort normal and breath sounds normal. No respiratory distress. She has no decreased breath sounds. She has no wheezes. She has no rhonchi. She has no rales.  Abdominal: Soft. Normal appearance and bowel sounds are normal. She exhibits no distension. There is tenderness in the left lower quadrant. There is CVA tenderness (L sided). There  is no rigidity, no rebound, no guarding, no tenderness at McBurney's point and negative Murphy's sign.    Soft, obese but nondistended, +BS throughout, with moderate L lateral abdominal TTP wrapping around towards the L flank, no r/g/r, neg murphy's, neg mcburney's, with mild L sided CVA TTP   Musculoskeletal: Normal range of motion.  Neurological: She is alert and oriented to person, place, and time. She has normal strength. No sensory deficit.  Skin: Skin is warm, dry and intact. No rash noted.  Psychiatric: She has a normal mood and affect.  Nursing note and vitals reviewed.   ED Course  Procedures (including critical care time) Labs Review Labs Reviewed  COMPREHENSIVE  METABOLIC PANEL - Abnormal; Notable for the following:    Creatinine, Ser 1.06 (*)    All other components within normal limits  CBC - Abnormal; Notable for the following:    RBC 3.79 (*)    Hemoglobin 10.8 (*)    HCT 34.9 (*)    All other components within normal limits  URINALYSIS, ROUTINE W REFLEX MICROSCOPIC (NOT AT [facility]) - Abnormal; Notable for the following:    APPearance CLOUDY (*)    All other components within normal limits  LIPASE, BLOOD  POC URINE PREG, ED    Imaging Review Ct Renal Stone Study  06/06/2015  CLINICAL DATA:  Acute onset of left lower quadrant abdominal pain, radiating to the left flank. Initial encounter. EXAM: CT ABDOMEN AND PELVIS WITHOUT CONTRAST TECHNIQUE: Multidetector CT imaging of the abdomen and pelvis was performed following the standard protocol without IV contrast. COMPARISON:  CT of the abdomen and pelvis performed 02/15/2014, and pelvic ultrasound performed 04/16/2004 FINDINGS: The visualized lung bases are clear. The liver and spleen are unremarkable in appearance. The gallbladder is somewhat decompressed and grossly unremarkable. The pancreas and adrenal glands are unremarkable. The kidneys are unremarkable in appearance. There is no evidence of hydronephrosis. No renal or ureteral stones are seen. No perinephric stranding is appreciated. No free fluid is identified. The small bowel is unremarkable in appearance. The stomach is within normal limits. No acute vascular abnormalities are seen. The appendix is normal in caliber and contains air, without evidence of appendicitis. The colon is unremarkable in appearance. The bladder is mildly distended and grossly unremarkable. The uterus is unremarkable. The ovaries are relatively symmetric. No suspicious adnexal masses are seen. No inguinal lymphadenopathy is seen. No acute osseous abnormalities are identified. IMPRESSION: Unremarkable noncontrast CT of the abdomen and pelvis. Electronically Signed   By: Roanna Raider M.D.   On: 06/06/2015 00:35   I have personally reviewed and evaluated these images and lab results as part of my medical decision-making.   EKG Interpretation None      MDM   Final diagnoses:  LLQ abdominal pain  Left flank pain  AKI (acute kidney injury) (HCC)  Chronic anemia  Abdominal gas pain  Constipation, unspecified constipation type    40 y.o. female here with LLQ/L flank pain x1 day, gradual onset. No other associated symptoms although she states at work she took her temp and had a fever of 102.0 but didn't take anything and she arrived here afebrile. +flu contacts at work. No other symptoms. On exam, L lateral abdomen tenderness wrapping around the flank area, nonperitoneal exam. Mild L CVA TTP. Upreg neg. U/A without UTI or hematuria. CBC with baseline anemia, no leukocytosis. Lipase WNL, CMP with Cr 1.06 slightly elevated from prior. Overall it seems consistent with kidney stone, but pelvic organ  etiology could still be possible although the tenderness seems to be more in the L lateral abdomen than in the LLQ/pelvic brim. Pt declines pain meds. Will proceed with CT renal study, if no etiology for symptoms returns, then may consider pelvic exam although highly doubt torsion/TOA/PID as an etiology given the location of symptoms. Of note, afebrile here, unclear why she would have a fever at work PTA but not here, no evidence of infection on her labs, doubt this is clinically significant today. Will reassess shortly  12:57 AM CT unremarkable, although on my review it shows moderate stool burden in the R colon and moderate gas in the L side, which would account for her symptoms. Pt offered a pelvic exam, but she declined-- and I doubt torsion or other emergent ovarian/pelvic etiology that would require urgent work up at this time. Discussed continuation of miralax to achieve daily soft BMs, add on metamucil. Consider gas-X or Bean-O. Start keeping food journal to help with  pinpointing certain foods that cause symptoms, discussed taking activia/yogurt to help. F/up with PCP in 1wk for ongoing management. Increase fiber/water intake. I explained the diagnosis and have given explicit precautions to return to the ER including for any other new or worsening symptoms. The patient understands and accepts the medical plan as it's been dictated and I have answered their questions. Discharge instructions concerning home care and prescriptions have been given. The patient is STABLE and is discharged to home in good condition.   BP 134/76 mmHg  Pulse 75  Temp(Src) 98.3 F (36.8 C) (Oral)  Resp 20  SpO2 100%  LMP 05/25/2015  No orders of the defined types were placed in this encounter.     Algis Lehenbauer Camprubi-Soms, PA-C 06/06/15 0100  Raeford Razor, MD 06/10/15 1556

## 2015-06-05 NOTE — ED Notes (Signed)
C/o sharp LLQ pain that radiates to L flank since last night.  Denies nausea, vomiting, diarrhea, or urinary complaints.

## 2015-06-05 NOTE — ED Notes (Signed)
Pt refusing pain meds

## 2015-06-06 NOTE — Discharge Instructions (Signed)
Your abdominal pain could be from gas pain/constipation. Continue taking miralax daily (or twice daily if needed) to achieve daily soft stools; if you start having loose watery stools, cut back and titrate as needed until you continue to achieve daily soft stools. Add in metamucil and increase fiber and water intake in your diet. Start using Activia or yogurt to help with gas production/constipation. Use tylenol or motrin as needed for pain. Keep a food journal to help pinpoint any specific foods that cause worsening symptoms. May consider using Bean-O or Gas-X for symptoms. Follow up with your regular doctor in 1 week for recheck of symptoms. Return to the ER for changes or worsening symptoms.  GETTING TO GOOD BOWEL HEALTH. Irregular bowel habits such as constipation and diarrhea can lead to many problems over time.  Having one soft bowel movement a day is the most important way to prevent further problems.  The anorectal canal is designed to handle stretching and feces to safely manage our ability to get rid of solid waste (feces, poop, stool) out of our body.  BUT, hard constipated stools can act like ripping concrete bricks and diarrhea can be a burning fire to this very sensitive area of our body, causing inflamed hemorrhoids, anal fissures, increasing risk is perirectal abscesses, abdominal pain/bloating, an making irritable bowel worse.     The goal: ONE SOFT BOWEL MOVEMENT A DAY!  To have soft, regular bowel movements:   Drink at least 8 tall glasses of water a day.    Take plenty of fiber.  Fiber is the undigested part of plant food that passes into the colon, acting s natures broom to encourage bowel motility and movement.  Fiber can absorb and hold large amounts of water. This results in a larger, bulkier stool, which is soft and easier to pass. Work gradually over several weeks up to 6 servings a day of fiber (25g a day even more if needed) in the form of: o Vegetables -- Root (potatoes,  carrots, turnips), leafy green (lettuce, salad greens, celery, spinach), or cooked high residue (cabbage, broccoli, etc) o Fruit -- Fresh (unpeeled skin & pulp), Dried (prunes, apricots, cherries, etc ),  or stewed ( applesauce)  o Whole grain breads, pasta, etc (whole wheat)  o Bran cereals   Bulking Agents -- This type of water-retaining fiber generally is easily obtained each day by one of the following:  o Psyllium bran -- The psyllium plant is remarkable because its ground seeds can retain so much water. This product is available as Metamucil, Konsyl, Effersyllium, Per Diem Fiber, or the less expensive generic preparation in drug and health food stores. Although labeled a laxative, it really is not a laxative.  o Methylcellulose -- This is another fiber derived from wood which also retains water. It is available as Citrucel. o Polyethylene Glycol - and artificial fiber commonly called Miralax or Glycolax.  It is helpful for people with gassy or bloated feelings with regular fiber o Flax Seed - a less gassy fiber than psyllium  No reading or other relaxing activity while on the toilet. If bowel movements take longer than 5 minutes, you are too constipated  AVOID CONSTIPATION.  High fiber and water intake usually takes care of this.  Sometimes a laxative is needed to stimulate more frequent bowel movements, but   Laxatives are not a good long-term solution as it can wear the colon out. o Osmotics (Milk of Magnesia, Fleets phosphosoda, Magnesium citrate, MiraLax, GoLytely) are safer than  o Stimulants (Senokot, Castor Oil, Dulcolax, Ex Lax)    o Do not take laxatives for more than 7days in a row.   IF SEVERELY CONSTIPATED, try a Bowel Retraining Program: o Do not use laxatives.  o Eat a diet high in roughage, such as bran cereals and leafy vegetables.  o Drink six (6) ounces of prune or apricot juice each morning.  o Eat two (2) large servings of stewed fruit each day.  o Take one (1)  heaping tablespoon of a psyllium-based bulking agent twice a day. Use sugar-free sweetener when possible to avoid excessive calories.  o Eat a normal breakfast.  o Set aside 15 minutes after breakfast to sit on the toilet, but do not strain to have a bowel movement.  o If you do not have a bowel movement by the third day, use an enema and repeat the above steps.   Controlling diarrhea o Switch to liquids and simpler foods for a few days to avoid stressing your intestines further. o Avoid dairy products (especially milk & ice cream) for a short time.  The intestines often can lose the ability to digest lactose when stressed. o Avoid foods that cause gassiness or bloating.  Typical foods include beans and other legumes, cabbage, broccoli, and dairy foods.  Every person has some sensitivity to other foods, so listen to our body and avoid those foods that trigger problems for you. o Adding fiber (Citrucel, Metamucil, psyllium, Miralax) gradually can help thicken stools by absorbing excess fluid and retrain the intestines to act more normally.  Slowly increase the dose over a few weeks.  Too much fiber too soon can backfire and cause cramping & bloating. o Probiotics (such as active yogurt, Align, etc) may help repopulate the intestines and colon with normal bacteria and calm down a sensitive digestive tract.  Most studies show it to be of mild help, though, and such products can be costly. o Medicines: - Bismuth subsalicylate (ex. Kayopectate, Pepto Bismol) every 30 minutes for up to 6 doses can help control diarrhea.  Avoid if pregnant. - Loperamide (Immodium) can slow down diarrhea.  Start with two tablets (4mg  total) first and then try one tablet every 6 hours.  Avoid if you are having fevers or severe pain.  If you are not better or start feeling worse, stop all medicines and call your doctor for advice o Call your doctor if you are getting worse or not better.  Sometimes further testing (cultures,  endoscopy, X-ray studies, bloodwork, etc) may be needed to help diagnose and treat the cause of the diarrhea.  Managing Pain  Pain after surgery or related to activity is often due to strain/injury to muscle, tendon, nerves and/or incisions.  This pain is usually short-term and will improve in a few months.   Many people find it helpful to do the following things TOGETHER to help speed the process of healing and to get back to regular activity more quickly:  1. Avoid heavy physical activity a.  no lifting greater than 20 pounds b. Do not push through the pain.  Listen to your body and avoid positions and maneuvers than reproduce the pain c. Walking is okay as tolerated, but go slowly and stop when getting sore.  d. Remember: If it hurts to do it, then dont do it! 2. Take Anti-inflammatory medication  a. Take with food/snack around the clock for 1-2 weeks i. This helps the muscle and nerve tissues become less irritable and calm down  faster b. Choose ONE of the following over-the-counter medications: i. Naproxen 220mg  tabs (ex. Aleve) 1-2 pills twice a day  ii. Ibuprofen 200mg  tabs (ex. Advil, Motrin) 3-4 pills with every meal and just before bedtime iii. Acetaminophen 500mg  tabs (Tylenol) 1-2 pills with every meal and just before bedtime 3. Use a Heating pad or Ice/Cold Pack a. 4-6 times a day b. May use warm bath/hottub  or showers 4. Try Gentle Massage and/or Stretching  a. at the area of pain many times a day b. stop if you feel pain - do not overdo it  Try these steps together to help you body heal faster and avoid making things get worse.  Doing just one of these things may not be enough.    If you are not getting better after two weeks or are noticing you are getting worse, contact our office for further advice; we may need to re-evaluate you & see what other things we can do to help.   Abdominal Pain, Adult Many things can cause belly (abdominal) pain. Most times, the belly  pain is not dangerous. Many cases of belly pain can be watched and treated at home. HOME CARE   Do not take medicines that help you go poop (laxatives) unless told to by your doctor.  Only take medicine as told by your doctor.  Eat or drink as told by your doctor. Your doctor will tell you if you should be on a special diet. GET HELP IF:  You do not know what is causing your belly pain.  You have belly pain while you are sick to your stomach (nauseous) or have runny poop (diarrhea).  You have pain while you pee or poop.  Your belly pain wakes you up at night.  You have belly pain that gets worse or better when you eat.  You have belly pain that gets worse when you eat fatty foods.  You have a fever. GET HELP RIGHT AWAY IF:   The pain does not go away within 2 hours.  You keep throwing up (vomiting).  The pain changes and is only in the right or left part of the belly.  You have bloody or tarry looking poop. MAKE SURE YOU:   Understand these instructions.  Will watch your condition.  Will get help right away if you are not doing well or get worse.   This information is not intended to replace advice given to you by your health care provider. Make sure you discuss any questions you have with your health care provider.   Document Released: 08/24/2007 Document Revised: 03/28/2014 Document Reviewed: 11/14/2012 Elsevier Interactive Patient Education 2016 [employer].  Constipation, Adult Constipation is when a person:  Poops (has a bowel movement) less than 3 times a week.  Has a hard time pooping.  Has poop that is dry, hard, or bigger than normal. HOME CARE   Eat foods with a lot of fiber in them. This includes fruits, vegetables, beans, and whole grains such as brown rice.  Avoid fatty foods and foods with a lot of sugar. This includes french fries, hamburgers, cookies, candy, and soda.  If you are not getting enough fiber from food, take products with added  fiber in them (supplements).  Drink enough fluid to keep your pee (urine) clear or pale yellow.  Exercise on a regular basis, or as told by your doctor.  Go to the restroom when you feel like you need to poop. Do not hold it.  Only take medicine as told by your doctor. Do not take medicines that help you poop (laxatives) without talking to your doctor first. GET HELP RIGHT AWAY IF:   You have bright red blood in your poop (stool).  Your constipation lasts more than 4 days or gets worse.  You have belly (abdominal) or butt (rectal) pain.  You have thin poop (as thin as a pencil).  You lose weight, and it cannot be explained. MAKE SURE YOU:   Understand these instructions.  Will watch your condition.  Will get help right away if you are not doing well or get worse.   This information is not intended to replace advice given to you by your health care provider. Make sure you discuss any questions you have with your health care provider.   Document Released: 08/24/2007 Document Revised: 03/28/2014 Document Reviewed: 12/17/2012 Elsevier Interactive Patient Education 2016 [employer]  High-Fiber Diet Fiber, also called dietary fiber, is a type of carbohydrate found in fruits, vegetables, whole grains, and beans. A high-fiber diet can have many health benefits. Your health care provider may recommend a high-fiber diet to help:  Prevent constipation. Fiber can make your bowel movements more regular.  Lower your cholesterol.  Relieve hemorrhoids, uncomplicated diverticulosis, or irritable bowel syndrome.  Prevent overeating as part of a weight-loss plan.  Prevent heart disease, type 2 diabetes, and certain cancers. WHAT IS MY PLAN? The recommended daily intake of fiber includes:  38 grams for men under age 38.  30 grams for men over age 38.  25 grams for women under age 38.  21 grams for women over age 38. You can get the recommended daily intake of dietary fiber by  eating a variety of fruits, vegetables, grains, and beans. Your health care provider may also recommend a fiber supplement if it is not possible to get enough fiber through your diet. WHAT DO I NEED TO KNOW ABOUT A HIGH-FIBER DIET?  Fiber supplements have not been widely studied for their effectiveness, so it is better to get fiber through food sources.  Always check the fiber content on thenutrition facts label of any prepackaged food. Look for foods that contain at least 5 grams of fiber per serving.  Ask your dietitian if you have questions about specific foods that are related to your condition, especially if those foods are not listed in the following section.  Increase your daily fiber consumption gradually. Increasing your intake of dietary fiber too quickly may cause bloating, cramping, or gas.  Drink plenty of water. Water helps you to digest fiber. WHAT FOODS CAN I EAT? Grains Whole-grain breads. Multigrain cereal. Oats and oatmeal. Brown rice. Barley. Bulgur wheat. Millet. Bran muffins. Popcorn. Rye wafer crackers. Vegetables Sweet potatoes. Spinach. Kale. Artichokes. Cabbage. Broccoli. Green peas. Carrots. Squash. Fruits Berries. Pears. Apples. Oranges. Avocados. Prunes and raisins. Dried figs. Meats and Other Protein Sources Navy, kidney, pinto, and soy beans. Split peas. Lentils. Nuts and seeds. Dairy Fiber-fortified yogurt. Beverages Fiber-fortified soy milk. Fiber-fortified orange juice. Other Fiber bars. The items listed above may not be a complete list of recommended foods or beverages. Contact your dietitian for more options. WHAT FOODS ARE NOT RECOMMENDED? Grains White bread. Pasta made with refined flour. White rice. Vegetables Fried potatoes. Canned vegetables. Well-cooked vegetables.  Fruits Fruit juice. Cooked, strained fruit. Meats and Other Protein Sources Fatty cuts of meat. Fried Environmental education officerpoultry or fried fish. Dairy Milk. Yogurt. Cream cheese. Sour  cream. Beverages Soft drinks. Other Cakes and pastries. Butter  and oils. The items listed above may not be a complete list of foods and beverages to avoid. Contact your dietitian for more information. WHAT ARE SOME TIPS FOR INCLUDING HIGH-FIBER FOODS IN MY DIET?  Eat a wide variety of high-fiber foods.  Make sure that half of all grains consumed each day are whole grains.  Replace breads and cereals made from refined flour or white flour with whole-grain breads and cereals.  Replace white rice with brown rice, bulgur wheat, or millet.  Start the day with a breakfast that is high in fiber, such as a cereal that contains at least 5 grams of fiber per serving.  Use beans in place of meat in soups, salads, or pasta.  Eat high-fiber snacks, such as berries, raw vegetables, nuts, or popcorn.   This information is not intended to replace advice given to you by your health care provider. Make sure you discuss any questions you have with your health care provider.   Document Released: 03/07/2005 Document Revised: 03/28/2014 Document Reviewed: 08/20/2013 Elsevier Interactive Patient Education [employer]

## 2015-06-22 ENCOUNTER — Encounter (HOSPITAL_COMMUNITY): Payer: Self-pay | Admitting: *Deleted

## 2015-06-22 ENCOUNTER — Emergency Department (INDEPENDENT_AMBULATORY_CARE_PROVIDER_SITE_OTHER)
Admission: EM | Admit: 2015-06-22 | Discharge: 2015-06-22 | Disposition: A | Discharge status: Home / Self Care | Payer: Managed Care, Other (non HMO) | Source: Home / Self Care

## 2015-06-22 DIAGNOSIS — R69 Illness, unspecified: Principal | ICD-10-CM

## 2015-06-22 DIAGNOSIS — J111 Influenza due to unidentified influenza virus with other respiratory manifestations: Secondary | ICD-10-CM

## 2015-06-22 MED ORDER — GUAIFENESIN-CODEINE 100-10 MG/5ML PO SYRP: 10.0000 mL | ORAL_SOLUTION | Freq: Four times a day (QID) | ORAL | Status: DC | PRN

## 2015-06-22 MED ORDER — IPRATROPIUM BROMIDE 0.06 % NA SOLN: 2.0000 | Freq: Four times a day (QID) | NASAL | Status: DC

## 2015-06-22 NOTE — ED Provider Notes (Signed)
CSN: [id number]     Arrival date & time 06/22/15  1919 History   None    Chief Complaint  Patient presents with  . URI   (Consider location/radiation/quality/duration/timing/severity/associated sxs/prior Treatment) Patient is a 40 y.o. female presenting with URI. The history is provided by the patient.  URI Presenting symptoms: congestion, cough, fatigue, fever and rhinorrhea   Presenting symptoms: no sore throat   Severity:  Moderate Onset quality:  Sudden Duration:  3 days Progression:  Unchanged Chronicity:  New Relieved by:  None tried Worsened by:  Nothing tried Ineffective treatments:  None tried Associated symptoms: myalgias     Past Medical History  Diagnosis Date  . Migraine   . Myalgia   . Anemia   . Myalgia and myositis, unspecified 11/28/2012  . Headache(784.0) 11/28/2012  . Obesity   . Iron deficiency anemia   . Depression   . Panic attack   . Fibromyalgia    Past Surgical History  Procedure Laterality Date  . Cesarean section    . Tubal ligation    . Carpal tunnel release Right    Family History  Problem Relation Age of Onset  . Depression Father   . Breast cancer Maternal Grandmother   . Arthritis/Rheumatoid Maternal Grandmother   . Lupus Paternal Grandmother    Social History  Substance Use Topics  . Smoking status: Never Smoker   . Smokeless tobacco: Never Used  . Alcohol Use: Yes     Comment: occasionally   OB History    No data available     Review of Systems  Constitutional: Positive for fever and fatigue.  HENT: Positive for congestion, postnasal drip and rhinorrhea. Negative for sore throat.   Respiratory: Positive for cough. Negative for shortness of breath.   Cardiovascular: Negative.   Gastrointestinal: Positive for nausea. Negative for vomiting and diarrhea.  Genitourinary: Negative.   Musculoskeletal: Positive for myalgias.  Skin: Negative.   All other systems reviewed and are negative.   Allergies  Review of patient's  allergies indicates no known allergies.  Home Medications   Prior to Admission medications   Medication Sig Start Date End Date Taking? Authorizing Provider  guaiFENesin-codeine (ROBITUSSIN AC) 100-10 MG/5ML syrup Take 10 mLs by mouth 4 (four) times daily as needed for cough. 06/22/15   Linna Hoff, MD  ibuprofen (ADVIL,MOTRIN) 800 MG tablet Take 1 tablet (800 mg total) by mouth 3 (three) times daily. Patient taking differently: Take 800 mg by mouth 3 (three) times daily as needed for moderate pain.  05/12/15   Domenick Gong, MD  ipratropium (ATROVENT) 0.06 % nasal spray Place 2 sprays into both nostrils 4 (four) times daily. 06/22/15   Linna Hoff, MD  iron polysaccharides (NIFEREX) 150 MG capsule Take 150 mg by mouth daily.    Historical Provider, MD  polyethylene glycol powder (GLYCOLAX/MIRALAX) powder Take 17 g by mouth daily. Patient taking differently: Take 17 g by mouth daily as needed for mild constipation.  05/12/15   Domenick Gong, MD   Meds Ordered and Administered this Visit  Medications - No data to display  BP 139/88 mmHg  Pulse 66  Temp(Src) 98.3 F (36.8 C) (Oral)  Resp 16  SpO2 100%  LMP 05/19/2015 No data found.   Physical Exam  Constitutional: She is oriented to person, place, and time. She appears well-developed and well-nourished. She appears distressed.  HENT:  Head: Normocephalic.  Right Ear: External ear normal.  Left Ear: External ear normal.  Mouth/Throat: Oropharynx  is clear and moist.  Eyes: Pupils are equal, round, and reactive to light.  Neck: Normal range of motion. Neck supple.  Cardiovascular: Regular rhythm, normal heart sounds and intact distal pulses.   Pulmonary/Chest: Effort normal and breath sounds normal.  Abdominal: Soft. Bowel sounds are normal. She exhibits no mass. There is no tenderness. There is no rebound and no guarding.  Lymphadenopathy:    She has no cervical adenopathy.  Neurological: She is alert and oriented to person,  place, and time.  Skin: Skin is warm and dry.  Nursing note and vitals reviewed.   ED Course  Procedures (including critical care time)  Labs Review Labs Reviewed - No data to display  Imaging Review No results found.   Visual Acuity Review  Right Eye Distance:   Left Eye Distance:   Bilateral Distance:    Right Eye Near:   Left Eye Near:    Bilateral Near:         MDM   1. Influenza-like illness        Linna HoffJames D Arrietty Dercole, MD 06/28/15 2107

## 2015-06-22 NOTE — ED Notes (Signed)
Pt  Reports  Symptoms  Of  Body  Aches chills   soretrhroat       With  Onset  Of  Symptoms  X  2-3  Days      '  pt  Reports  Symptoms  Not relieved  By otc  meds     Symptoms     Of  Cough is  For the  Most  Part  Non productive

## 2015-08-06 ENCOUNTER — Encounter (HOSPITAL_COMMUNITY): Payer: Self-pay | Admitting: Emergency Medicine

## 2015-08-06 ENCOUNTER — Emergency Department (HOSPITAL_COMMUNITY)
Admission: EM | Admit: 2015-08-06 | Discharge: 2015-08-06 | Disposition: A | Discharge status: Home / Self Care | Payer: Managed Care, Other (non HMO) | Attending: Emergency Medicine | Admitting: Emergency Medicine

## 2015-08-06 DIAGNOSIS — M25561 Pain in right knee: Secondary | ICD-10-CM | POA: Insufficient documentation

## 2015-08-06 DIAGNOSIS — M25562 Pain in left knee: Secondary | ICD-10-CM | POA: Insufficient documentation

## 2015-08-06 DIAGNOSIS — M797 Fibromyalgia: Secondary | ICD-10-CM

## 2015-08-06 DIAGNOSIS — M79642 Pain in left hand: Secondary | ICD-10-CM | POA: Diagnosis not present

## 2015-08-06 DIAGNOSIS — M79641 Pain in right hand: Secondary | ICD-10-CM | POA: Insufficient documentation

## 2015-08-06 DIAGNOSIS — E669 Obesity, unspecified: Secondary | ICD-10-CM | POA: Insufficient documentation

## 2015-08-06 DIAGNOSIS — M25571 Pain in right ankle and joints of right foot: Secondary | ICD-10-CM | POA: Insufficient documentation

## 2015-08-06 DIAGNOSIS — F410 Panic disorder [episodic paroxysmal anxiety] without agoraphobia: Secondary | ICD-10-CM | POA: Diagnosis not present

## 2015-08-06 DIAGNOSIS — Z79891 Long term (current) use of opiate analgesic: Secondary | ICD-10-CM | POA: Diagnosis not present

## 2015-08-06 DIAGNOSIS — F329 Major depressive disorder, single episode, unspecified: Secondary | ICD-10-CM | POA: Diagnosis not present

## 2015-08-06 DIAGNOSIS — Z6831 Body mass index (BMI) 31.0-31.9, adult: Secondary | ICD-10-CM | POA: Diagnosis not present

## 2015-08-06 DIAGNOSIS — M25572 Pain in left ankle and joints of left foot: Secondary | ICD-10-CM | POA: Insufficient documentation

## 2015-08-06 DIAGNOSIS — Z79899 Other long term (current) drug therapy: Secondary | ICD-10-CM | POA: Diagnosis not present

## 2015-08-06 LAB — CBC
HCT: 33.7 % — ABNORMAL LOW (ref 36.0–46.0)
Hemoglobin: 11 g/dL — ABNORMAL LOW (ref 12.0–15.0)
MCH: 29.3 pg (ref 26.0–34.0)
MCHC: 32.6 g/dL (ref 30.0–36.0)
MCV: 89.6 fL (ref 78.0–100.0)
Platelets: 291 10*3/uL (ref 150–400)
RBC: 3.76 MIL/uL — ABNORMAL LOW (ref 3.87–5.11)
RDW: 13.3 % (ref 11.5–15.5)
WBC: 6.5 10*3/uL (ref 4.0–10.5)

## 2015-08-06 LAB — BASIC METABOLIC PANEL
Anion gap: 6 (ref 5–15)
BUN: 15 mg/dL (ref 6–20)
CO2: 25 mmol/L (ref 22–32)
Calcium: 9.4 mg/dL (ref 8.9–10.3)
Chloride: 106 mmol/L (ref 101–111)
Creatinine, Ser: 1.09 mg/dL — ABNORMAL HIGH (ref 0.44–1.00)
GFR calc Af Amer: >60 mL/min (ref >60)
GFR calc non Af Amer: >60 mL/min (ref >60)
Glucose, Bld: 106 mg/dL — ABNORMAL HIGH (ref 65–99)
Potassium: 3.8 mmol/L (ref 3.5–5.1)
Sodium: 137 mmol/L (ref 135–145)

## 2015-08-06 MED ORDER — HYDROCODONE-ACETAMINOPHEN 5-325 MG PO TABS: 1.0000 | ORAL_TABLET | Freq: Four times a day (QID) | ORAL | Status: DC | PRN

## 2015-08-06 MED ORDER — CYCLOBENZAPRINE HCL 5 MG PO TABS: 5.0000 mg | ORAL_TABLET | Freq: Three times a day (TID) | ORAL | Status: DC | PRN

## 2015-08-06 MED ORDER — CYCLOBENZAPRINE HCL 10 MG PO TABS
5.0000 mg | ORAL_TABLET | Freq: Once | ORAL | Status: AC
  Administered 2015-08-06: 5 mg via ORAL
  Filled 2015-08-06: qty 1

## 2015-08-06 MED ORDER — HYDROCODONE-ACETAMINOPHEN 5-325 MG PO TABS
1.0000 | ORAL_TABLET | Freq: Once | ORAL | Status: AC
  Administered 2015-08-06: 1 via ORAL
  Filled 2015-08-06: qty 1

## 2015-08-06 MED ORDER — AMITRIPTYLINE HCL 50 MG PO TABS
50.0000 mg | ORAL_TABLET | Freq: Two times a day (BID) | ORAL | Status: DC
Start: 2015-08-06 — End: 2018-02-23

## 2015-08-06 MED ORDER — AMITRIPTYLINE HCL 25 MG PO TABS
50.0000 mg | ORAL_TABLET | Freq: Every day | ORAL | Status: DC
  Administered 2015-08-06: 50 mg via ORAL
  Filled 2015-08-06: qty 2

## 2015-08-06 NOTE — ED Provider Notes (Signed)
CSN: [id number]     Arrival date & time 08/06/15  1657 History  By signing my name below, I, Marisue Humble, attest that this documentation has been prepared under the direction and in the presence of non-physician practitioner, Earley Favor, NP. Electronically Signed: Marisue Humble, Scribe. 08/06/2015. 9:53 PM.   Chief Complaint  Patient presents with  . Fibromyalgia  . Hand Problem   The history is provided by the patient. No language interpreter was used.   HPI Comments:  Julie Schwartz is a 40 y.o. female with PMHx of fibromyalgia who presents to the Emergency Department complaining of numbness and pain in both hands onset yesterday. She states numbness progressed to pain today; her pain radiates up into her arms. Pt notes she has enough sensation to pick objects up. Pt reports associated knee pain when standing. No alleviating factors noted or treatments attempted PTA. She has not had a flare-up or taken medications for fibromyalgia for over a year; she normally tries meditation and deep breathing for pain. She reports taking Lyrica then switching to Amitriptyline and Flexeril during past flare-ups. Denies neck pain.   Past Medical History  Diagnosis Date  . Migraine   . Myalgia   . Anemia   . Myalgia and myositis, unspecified 11/28/2012  . Headache(784.0) 11/28/2012  . Obesity   . Iron deficiency anemia   . Depression   . Panic attack   . Fibromyalgia    Past Surgical History  Procedure Laterality Date  . Cesarean section    . Tubal ligation    . Carpal tunnel release Right    Family History  Problem Relation Age of Onset  . Depression Father   . Breast cancer Maternal Grandmother   . Arthritis/Rheumatoid Maternal Grandmother   . Lupus Paternal Grandmother    Social History  Substance Use Topics  . Smoking status: Never Smoker   . Smokeless tobacco: Never Used  . Alcohol Use: Yes     Comment: occasionally   OB History    No data available     Review of  Systems  Musculoskeletal: Positive for arthralgias.  Neurological: Positive for numbness.  All other systems reviewed and are negative.   Allergies  Review of patient's allergies indicates no known allergies.  Home Medications   Prior to Admission medications   Medication Sig Start Date End Date Taking? Authorizing Provider  amitriptyline (ELAVIL) 50 MG tablet Take 1 tablet (50 mg total) by mouth 2 (two) times daily. 08/06/15 08/16/15  Earley Favor, NP  cyclobenzaprine (FLEXERIL) 5 MG tablet Take 1 tablet (5 mg total) by mouth 3 (three) times daily as needed for muscle spasms. 08/06/15   Earley Favor, NP  guaiFENesin-codeine (ROBITUSSIN AC) 100-10 MG/5ML syrup Take 10 mLs by mouth 4 (four) times daily as needed for cough. Patient not taking: Reported on 08/06/2015 06/22/15   Linna Hoff, MD  HYDROcodone-acetaminophen (NORCO/VICODIN) 5-325 MG tablet Take 1 tablet by mouth every 6 (six) hours as needed for moderate pain or severe pain. 08/06/15   Earley Favor, NP  ibuprofen (ADVIL,MOTRIN) 800 MG tablet Take 1 tablet (800 mg total) by mouth 3 (three) times daily. Patient not taking: Reported on 08/06/2015 05/12/15   Domenick Gong, MD  ipratropium (ATROVENT) 0.06 % nasal spray Place 2 sprays into both nostrils 4 (four) times daily. Patient not taking: Reported on 08/06/2015 06/22/15   Linna Hoff, MD  polyethylene glycol powder (GLYCOLAX/MIRALAX) powder Take 17 g by mouth daily. Patient not taking: Reported on  08/06/2015 05/12/15   Domenick GongAshley Mortenson, MD   BP 125/92 mmHg  Pulse 81  Temp(Src) 97.9 F (36.6 C) (Oral)  Resp 18  Ht 5\' 9"  (1.753 m)  Wt 95.255 kg  BMI 31.00 kg/m2  SpO2 100% Physical Exam  Constitutional: She is oriented to person, place, and time. She appears well-developed and well-nourished.  HENT:  Head: Normocephalic.  Eyes: Pupils are equal, round, and reactive to light.  Neck: Normal range of motion.  Cardiovascular: Normal rate and regular rhythm.   Pulmonary/Chest: Effort  normal.  Abdominal: Soft.  Musculoskeletal: She exhibits tenderness. She exhibits no edema.       Arms:      Legs: Neurological: She is alert and oriented to person, place, and time.  Skin: Skin is warm.  Nursing note and vitals reviewed.   ED Course  Procedures  DIAGNOSTIC STUDIES:  Oxygen Saturation is 100% on RA, normal by my interpretation.    COORDINATION OF CARE:  9:47 PM Will administer amitriptyline, flexeril and hydrocodone. Discussed treatment plan with pt at bedside and pt agreed to plan.  Labs Review Labs Reviewed  BASIC METABOLIC PANEL - Abnormal; Notable for the following:    Glucose, Bld 106 (*)    Creatinine, Ser 1.09 (*)    All other components within normal limits  CBC - Abnormal; Notable for the following:    RBC 3.76 (*)    Hemoglobin 11.0 (*)    HCT 33.7 (*)    All other components within normal limits    Imaging Review No results found. I have personally reviewed and evaluated these images and lab results as part of my medical decision-making.   EKG Interpretation None      MDM  Patient states that the past year.  She has not needed any medication for her fibromyalgia.  She's been doing homeopathic therapies, such as meditation, deep breathing.  She started having "numbness yesterday.  That progressed to pain from mid upper arm to fingertips as well as knees and ankles.  This is typical of her fibromyalgia flares.  She states in the past.  She has been treated with amitriptyline, Flexeril with good result.  I will restart these medications for her, give her 3 days worth of Norco for pain control as well and recommend that she follow-up with her primary care physician Final diagnoses:  Fibromyalgia affecting multiple sites    I personally performed the services described in this documentation, which was scribed in my presence. The recorded information has been reviewed and is accurate.   Earley FavorGail Camreigh Michie, NP 08/06/15 [id number]  Pricilla LovelessScott Goldston,  MD 08/07/15 [id number]

## 2015-08-06 NOTE — ED Notes (Signed)
Pt feels like she is having a fibromyalgia flare-up. Whenever she has pains both of her hands feel numb since yesterday. No leg weakness or numbness.

## 2015-08-06 NOTE — Discharge Instructions (Signed)
Myofascial Pain Syndrome and Fibromyalgia Myofascial pain syndrome and fibromyalgia are both pain disorders. This pain may be felt mainly in your muscles.   Myofascial pain syndrome:  Always has trigger points or tender points in the muscle that will cause pain when pressed. The pain may come and go.  Usually affects your neck, upper back, and shoulder areas. The pain often radiates into your arms and hands.  Fibromyalgia:  Has muscle pains and tenderness that come and go.  Is often associated with fatigue and sleep disturbances.  Has trigger points.  Tends to be long-lasting (chronic), but is not life-threatening. Fibromyalgia and myofascial pain are not the same. However, they often occur together. If you have both conditions, each can make the other worse. Both are common and can cause enough pain and fatigue to make day-to-day activities difficult.  CAUSES  The exact causes of fibromyalgia and myofascial pain are not known. People with certain gene types may be more likely to develop fibromyalgia. Some factors can be triggers for both conditions, such as:   Spine disorders.  Arthritis.  Severe injury (trauma) and other physical stressors.  Being under a lot of stress.  A medical illness. SIGNS AND SYMPTOMS  Fibromyalgia The main symptom of fibromyalgia is widespread pain and tenderness in your muscles. This can vary over time. Pain is sometimes described as stabbing, shooting, or burning. You may have tingling or numbness, too. You may also have sleep problems and fatigue. You may wake up feeling tired and groggy (fibro fog). Other symptoms may include:   Bowel and bladder problems.  Headaches.  Visual problems.  Problems with odors and noises.  Depression or mood changes.  Painful menstrual periods (dysmenorrhea).  Dry skin or eyes. Myofascial pain syndrome Symptoms of myofascial pain syndrome include:   Tight, ropy bands of muscle.   Uncomfortable  sensations in muscular areas, such as:  Aching.  Cramping.  Burning.  Numbness.  Tingling.   Muscle weakness.  Trouble moving certain muscles freely (range of motion). DIAGNOSIS  There are no specific tests to diagnose fibromyalgia or myofascial pain syndrome. Both can be hard to diagnose because their symptoms are common in many other conditions. Your health care provider may suspect one or both of these conditions based on your symptoms and medical history. Your health care provider will also do a physical exam.  The key to diagnosing fibromyalgia is having pain, fatigue, and other symptoms for more than three months that cannot be explained by another condition.  The key to diagnosing myofascial pain syndrome is finding trigger points in muscles that are tender and cause pain elsewhere in your body (referred pain). TREATMENT  Treating fibromyalgia and myofascial pain often requires a team of health care providers. This usually starts with your primary provider and a physical therapist. You may also find it helpful to work with alternative health care providers, such as massage therapists or acupuncturists. Treatment for fibromyalgia may include medicines. This may include nonsteroidal anti-inflammatory drugs (NSAIDs), along with other medicines.  Treatment for myofascial pain may also include:  NSAIDs.  Cooling and stretching of muscles.  Trigger point injections.  Sound wave (ultrasound) treatments to stimulate muscles. HOME CARE INSTRUCTIONS   Take medicines only as directed by your health care provider.  Exercise as directed by your health care provider or physical therapist.  Try to avoid stressful situations.  Practice relaxation techniques to control your stress. You may want to try:  Biofeedback.  Visual imagery.  Hypnosis.  Muscle relaxation.  Yoga.  Meditation.  Talk to your health care provider about alternative treatments, such as acupuncture or  massage treatment.  Maintain a healthy lifestyle. This includes eating a healthy diet and getting enough sleep.  Consider joining a support group.  Do not do activities that stress or strain your muscles. That includes repetitive motions and heavy lifting. SEEK MEDICAL CARE IF:   You have new symptoms.  Your symptoms get worse.  You have side effects from your medicines.  You have trouble sleeping.  Your condition is causing depression or anxiety. FOR MORE INFORMATION   National Fibromyalgia Association: http://www.fmaware.orgwww.fmaware.org  Arthritis Foundation: http://www.arthritis.orgwww.arthritis.org  American Chronic Pain Association: michaeledo.comhttp://www.theacpa.org/condition/myofascial-painwww.CandyDash.co.zatheacpa.org/condition/myofascial-pain   This information is not intended to replace advice given to you by your health care provider. Make sure you discuss any questions you have with your health care provider.   Document Released: 03/07/2005 Document Revised: 03/28/2014 Document Reviewed: 12/11/2013 Elsevier Interactive Patient Education [employer]2016 [employer] I have restarted to previous fibromyalgia treatment therapies that have been successful you for you in the past, which include amitriptyline 50 mg twice a day, cyclobenzaprine or Flexeril 5 mg 3 times a day.  Also given you a prescription for Norco for the next 1-2 days for severe pain until the amitriptyline becomes effective.  Please call your primary care physician to set an appointment for follow-up.

## 2015-08-06 NOTE — ED Notes (Signed)
Patient was alert, oriented and stable upon discharge. RN went over AVS and patient had no further questions.  

## 2015-10-17 ENCOUNTER — Encounter (HOSPITAL_COMMUNITY): Payer: Self-pay | Admitting: Emergency Medicine

## 2015-10-17 ENCOUNTER — Emergency Department (HOSPITAL_COMMUNITY): Payer: Managed Care, Other (non HMO)

## 2015-10-17 ENCOUNTER — Emergency Department (HOSPITAL_COMMUNITY)
Admission: EM | Admit: 2015-10-17 | Discharge: 2015-10-17 | Disposition: A | Discharge status: Home / Self Care | Payer: Managed Care, Other (non HMO) | Attending: Emergency Medicine | Admitting: Emergency Medicine

## 2015-10-17 DIAGNOSIS — R072 Precordial pain: Secondary | ICD-10-CM | POA: Insufficient documentation

## 2015-10-17 DIAGNOSIS — F329 Major depressive disorder, single episode, unspecified: Secondary | ICD-10-CM | POA: Diagnosis not present

## 2015-10-17 DIAGNOSIS — Z791 Long term (current) use of non-steroidal anti-inflammatories (NSAID): Secondary | ICD-10-CM | POA: Insufficient documentation

## 2015-10-17 DIAGNOSIS — Z79899 Other long term (current) drug therapy: Secondary | ICD-10-CM | POA: Diagnosis not present

## 2015-10-17 DIAGNOSIS — R079 Chest pain, unspecified: Secondary | ICD-10-CM

## 2015-10-17 LAB — CBC WITH DIFFERENTIAL/PLATELET
Basophils Absolute: 0 10*3/uL (ref 0.0–0.1)
Basophils Relative: 0 %
Eosinophils Absolute: 0.1 10*3/uL (ref 0.0–0.7)
Eosinophils Relative: 1 %
HCT: 31.5 % — ABNORMAL LOW (ref 36.0–46.0)
Hemoglobin: 10.4 g/dL — ABNORMAL LOW (ref 12.0–15.0)
Lymphocytes Relative: 55 %
Lymphs Abs: 2.7 10*3/uL (ref 0.7–4.0)
MCH: 30.4 pg (ref 26.0–34.0)
MCHC: 33 g/dL (ref 30.0–36.0)
MCV: 92.1 fL (ref 78.0–100.0)
Monocytes Absolute: 0.4 10*3/uL (ref 0.1–1.0)
Monocytes Relative: 7 %
Neutro Abs: 1.8 10*3/uL (ref 1.7–7.7)
Neutrophils Relative %: 37 %
Platelets: 235 10*3/uL (ref 150–400)
RBC: 3.42 MIL/uL — ABNORMAL LOW (ref 3.87–5.11)
RDW: 13.2 % (ref 11.5–15.5)
WBC: 5 10*3/uL (ref 4.0–10.5)

## 2015-10-17 LAB — BASIC METABOLIC PANEL
Anion gap: 9 (ref 5–15)
BUN: 14 mg/dL (ref 6–20)
CO2: 23 mmol/L (ref 22–32)
Calcium: 9.1 mg/dL (ref 8.9–10.3)
Chloride: 107 mmol/L (ref 101–111)
Creatinine, Ser: 0.95 mg/dL (ref 0.44–1.00)
GFR calc Af Amer: >60 mL/min (ref >60)
GFR calc non Af Amer: >60 mL/min (ref >60)
Glucose, Bld: 90 mg/dL (ref 65–99)
Potassium: 3.7 mmol/L (ref 3.5–5.1)
Sodium: 139 mmol/L (ref 135–145)

## 2015-10-17 LAB — TROPONIN I: Troponin I: <0.03 ng/mL (ref <0.03)

## 2015-10-17 NOTE — ED Notes (Signed)
EDP at bedside updating patient and family. 

## 2015-10-17 NOTE — ED Notes (Signed)
EDP at bedside  

## 2015-10-17 NOTE — ED Provider Notes (Signed)
[facility]-EMER[facility]ENCY DEPT Provider Note   CSN: [id number] Arrival date & time: 10/17/15  1125  First Provider Contact:   First MD Initiated Contact with Patient 10/17/15 1129     By signing my name below, I, Tanda Rockers, attest that this documentation has been prepared under the direction and in the presence of Eber Hong, MD. Electronically Signed: Tanda Rockers, ED Scribe. 10/17/15. 11:39 AM   History   Chief Complaint No chief complaint on file.   HPI Julie Schwartz is a 40 y.o. female who presents to the Emergency Department complaining of gradual onset, intermittent, cramping, non-radiating substernal chest pain that began last night. Pt reports that the pain subsided last night prior to going to sleep and returned this morning while driving from [place] to [place]. She reports that the pain feels worse today than it did last night. Denies leg swelling, shortness of breath, fever, cough, nausea, vomiting, weakness, numbness, unexpected weight loss or weight gain, dysuria, hematochezia, or any other associated symptoms. Denies all risk factors for acute coronary syndrome and pulmonary emboli   The history is provided by the patient. No language interpreter was used.    Past Medical History:  Diagnosis Date  . Anemia   . Depression   . Fibromyalgia   . Headache(784.0) 11/28/2012  . Iron deficiency anemia   . Migraine   . Myalgia   . Myalgia and myositis, unspecified 11/28/2012  . Obesity   . Panic attack     Patient Active Problem List   Diagnosis Date Noted  . Myalgia and myositis, unspecified 11/28/2012  . Headache(784.0) 11/28/2012  . Disturbance of skin sensation 11/28/2012    Past Surgical History:  Procedure Laterality Date  . CARPAL TUNNEL RELEASE Right   . CESAREAN SECTION    . TUBAL LIGATION      OB History    No data available       Home Medications    Prior to Admission medications   Medication Sig Start Date End Date Taking?  Authorizing Provider  amitriptyline (ELAVIL) 50 MG tablet Take 1 tablet (50 mg total) by mouth 2 (two) times daily. 08/06/15 08/16/15  Earley Favor, NP  cyclobenzaprine (FLEXERIL) 5 MG tablet Take 1 tablet (5 mg total) by mouth 3 (three) times daily as needed for muscle spasms. 08/06/15   Earley Favor, NP  guaiFENesin-codeine (ROBITUSSIN AC) 100-10 MG/5ML syrup Take 10 mLs by mouth 4 (four) times daily as needed for cough. Patient not taking: Reported on 08/06/2015 06/22/15   Linna Hoff, MD  HYDROcodone-acetaminophen (NORCO/VICODIN) 5-325 MG tablet Take 1 tablet by mouth every 6 (six) hours as needed for moderate pain or severe pain. 08/06/15   Earley Favor, NP  ibuprofen (ADVIL,MOTRIN) 800 MG tablet Take 1 tablet (800 mg total) by mouth 3 (three) times daily. Patient not taking: Reported on 08/06/2015 05/12/15   Domenick Gong, MD  ipratropium (ATROVENT) 0.06 % nasal spray Place 2 sprays into both nostrils 4 (four) times daily. Patient not taking: Reported on 08/06/2015 06/22/15   Linna Hoff, MD  polyethylene glycol powder (GLYCOLAX/MIRALAX) powder Take 17 g by mouth daily. Patient not taking: Reported on 08/06/2015 05/12/15   Domenick Gong, MD    Family History Family History  Problem Relation Age of Onset  . Depression Father   . Breast cancer Maternal Grandmother   . Arthritis/Rheumatoid Maternal Grandmother   . Lupus Paternal Grandmother     Social History Social History  Substance Use Topics  . Smoking status:  Never Smoker  . Smokeless tobacco: Never Used  . Alcohol use Yes     Comment: occasionally     Allergies   Review of patient's allergies indicates no known allergies.   Review of Systems Review of Systems  Constitutional: Negative for fever and unexpected weight change.  Respiratory: Negative for cough and shortness of breath.   Cardiovascular: Positive for chest pain. Negative for leg swelling.  Gastrointestinal: Negative for blood in stool, nausea and vomiting.    Genitourinary: Negative for dysuria.  Neurological: Negative for weakness and numbness.  All other systems reviewed and are negative.    Physical Exam Updated Vital Signs There were no vitals taken for this visit.  Physical Exam  Constitutional: She appears well-developed and well-nourished. No distress.  HENT:  Head: Normocephalic and atraumatic.  Mouth/Throat: Oropharynx is clear and moist. No oropharyngeal exudate.  Eyes: Conjunctivae and EOM are normal. Pupils are equal, round, and reactive to light. Right eye exhibits no discharge. Left eye exhibits no discharge. No scleral icterus.  Neck: Normal range of motion. Neck supple. No JVD present. No thyromegaly present.  Cardiovascular: Normal rate, regular rhythm, normal heart sounds and intact distal pulses.  Exam reveals no gallop and no friction rub.   No murmur heard. Pulmonary/Chest: Effort normal and breath sounds normal. No respiratory distress. She has no wheezes. She has no rales. She exhibits tenderness.  Diffuse tenderness over central and upper chest, reproducible to palpation  Abdominal: Soft. Bowel sounds are normal. She exhibits no distension and no mass. There is no tenderness.  Musculoskeletal: Normal range of motion. She exhibits no edema or tenderness.  Lymphadenopathy:    She has no cervical adenopathy.  Neurological: She is alert. Coordination normal.  Skin: Skin is warm and dry. No rash noted. No erythema.  Psychiatric: She has a normal mood and affect. Her behavior is normal.  Nursing note and vitals reviewed.   ED Treatments / Results   DIAGNOSTIC STUDIES: Oxygen Saturation is 98% on RA, normal by my interpretation.    COORDINATION OF CARE: 11:36 AM-Discussed treatment plan with pt at bedside and pt agreed to plan.   Labs (all labs ordered are listed, but only abnormal results are displayed) Labs Reviewed - No data to display  EKG  EKG Interpretation  Date/Time:  Saturday October 17 2015 11:31:14  EDT Ventricular Rate:  74 PR Interval:    QRS Duration: 81 QT Interval:  388 QTC Calculation: 431 R Axis:   42 Text Interpretation:  Sinus rhythm Low voltage, precordial leads Baseline wander in lead(s) II V1 V2 V4 V6 since last tracing no significant change Confirmed by Moranda Billiot  MD, Danashia Landers ([id number]) on 10/17/2015 11:38:26 AM       Radiology No results found.  Procedures Procedures (including critical care time)  Medications Ordered in ED Medications - No data to display   Initial Impression / Assessment and Plan / ED Course  I have reviewed the triage vital signs and the nursing notes.  Pertinent labs & imaging results that were available during my care of the patient were reviewed by me and considered in my medical decision making (see chart for details).  Clinical Course  Comment By Time  Initial workup will include evaluation for cardiac and pulmonary disease, she has no risk factors for either acute coronary syndrome or pulmonary embolism and does have pain with palpation over her chest wall with a history of fibromyalgia. Her symptoms were not exertional and her EKG is totally normal. The  patient was in agreement with the initial workup Eber Hong, MD 07/29 1136  Hemoglobin stable over time, troponin negative, EKG unremarkable, the patient is requesting discharge. At this time I think it is reasonable as there is no objective findings of an acute coronary syndrome and she is very low risk. Eber Hong, MD 07/29 1427    I personally performed the services described in this documentation, which was scribed in my presence. The recorded information has been reviewed and is accurate.      Final Clinical Impressions(s) / ED Diagnoses   Final diagnoses:  Chest pain, unspecified chest pain type    New Prescriptions New Prescriptions   No medications on file     Eber Hong, MD 10/17/15 1429

## 2015-10-17 NOTE — Discharge Instructions (Signed)

## 2015-10-17 NOTE — ED Triage Notes (Signed)
Patient c/o mid-sternal chest pain, non-radiating. Per patient started last night, improved and then started again while driving this morning. Denies any coughing or straining. Denies any cardiac hx. Per patient shortness of breath upon excertion.

## 2016-01-18 ENCOUNTER — Emergency Department (HOSPITAL_COMMUNITY)
Admission: EM | Admit: 2016-01-18 | Discharge: 2016-01-18 | Disposition: A | Discharge status: Home / Self Care | Payer: Managed Care, Other (non HMO) | Attending: Emergency Medicine | Admitting: Emergency Medicine

## 2016-01-18 ENCOUNTER — Encounter (HOSPITAL_COMMUNITY): Payer: Self-pay | Admitting: Emergency Medicine

## 2016-01-18 DIAGNOSIS — Z79899 Other long term (current) drug therapy: Secondary | ICD-10-CM | POA: Diagnosis not present

## 2016-01-18 DIAGNOSIS — B349 Viral infection, unspecified: Secondary | ICD-10-CM

## 2016-01-18 DIAGNOSIS — R69 Illness, unspecified: Secondary | ICD-10-CM

## 2016-01-18 DIAGNOSIS — J111 Influenza due to unidentified influenza virus with other respiratory manifestations: Secondary | ICD-10-CM | POA: Diagnosis not present

## 2016-01-18 DIAGNOSIS — R05 Cough: Secondary | ICD-10-CM | POA: Diagnosis present

## 2016-01-18 MED ORDER — DEXAMETHASONE SODIUM PHOSPHATE 10 MG/ML IJ SOLN
10.0000 mg | Freq: Once | INTRAMUSCULAR | Status: AC
  Administered 2016-01-18: 10 mg via INTRAMUSCULAR
  Filled 2016-01-18: qty 1

## 2016-01-18 MED ORDER — BENZONATATE 100 MG PO CAPS: 100.0000 mg | ORAL_CAPSULE | Freq: Three times a day (TID) | ORAL | 0 refills | Status: DC | PRN

## 2016-01-18 MED ORDER — BENZONATATE 100 MG PO CAPS
100.0000 mg | ORAL_CAPSULE | Freq: Once | ORAL | Status: AC
  Administered 2016-01-18: 100 mg via ORAL
  Filled 2016-01-18: qty 1

## 2016-01-18 MED ORDER — IBUPROFEN 800 MG PO TABS: 800.0000 mg | ORAL_TABLET | Freq: Three times a day (TID) | ORAL | 0 refills | Status: DC

## 2016-01-18 NOTE — ED Triage Notes (Signed)
Pt c/o cough, hoarseness, chest pain and body pain, clear rhinorrhea, nonproductive, cough, sneeze onset yesterday. Subjective fever, treated with Tylenol. No sore throat, ear.

## 2016-01-18 NOTE — ED Provider Notes (Signed)
[facility]-EMERGENCY DEPT Provider Note   CSN: [id number] Arrival date & time: 01/18/16  0708     History   Chief Complaint No chief complaint on file.   HPI Julie Schwartz is a 40 y.o. female.  40 year old female with a day of sore throat, cough and then this morning woke up with decreased ability to speak secondary to hoarse voice. This is improved. She continues to have chest pain secondary to her cough. No fevers today with subjective fever yesterday. No difficulty swallowing or breathing. No sick contacts but does have young children at home.    URI   This is a new problem. The current episode started yesterday. The problem has been gradually worsening. Associated symptoms include chest pain (with coughing), congestion, sneezing and cough. Pertinent negatives include no headaches and no rhinorrhea. She has tried NSAIDs for the symptoms.    Past Medical History:  Diagnosis Date  . Anemia   . Depression   . Fibromyalgia   . Headache(784.0) 11/28/2012  . Iron deficiency anemia   . Migraine   . Myalgia   . Myalgia and myositis, unspecified 11/28/2012  . Obesity   . Panic attack     Patient Active Problem List   Diagnosis Date Noted  . Myalgia and myositis, unspecified 11/28/2012  . Headache(784.0) 11/28/2012  . Disturbance of skin sensation 11/28/2012    Past Surgical History:  Procedure Laterality Date  . CARPAL TUNNEL RELEASE Right   . CESAREAN SECTION    . TUBAL LIGATION      OB History    Gravida Para Term Preterm AB Living   2 2 2     2    SAB TAB Ectopic Multiple Live Births                   Home Medications    Prior to Admission medications   Medication Sig Start Date End Date Taking? Authorizing Provider  amitriptyline (ELAVIL) 50 MG tablet Take 1 tablet (50 mg total) by mouth 2 (two) times daily. Patient not taking: Reported on 10/17/2015 08/06/15 08/16/15  Earley FavorGail Schulz, NP  benzonatate (TESSALON) 100 MG capsule Take 1 capsule (100 mg total) by  mouth 3 (three) times daily as needed for cough. 01/18/16   Marily MemosJason Renold Kozar, MD  cyclobenzaprine (FLEXERIL) 5 MG tablet Take 1 tablet (5 mg total) by mouth 3 (three) times daily as needed for muscle spasms. Patient not taking: Reported on 10/17/2015 08/06/15   Earley FavorGail Schulz, NP  guaiFENesin-codeine [facility](ROBITUSSIN AC) 100-10 MG/5ML syrup Take 10 mLs by mouth 4 (four) times daily as needed for cough. Patient not taking: Reported on 08/06/2015 06/22/15   Linna HoffJames D Kindl, MD  HYDROcodone-acetaminophen (NORCO/VICODIN) 5-325 MG tablet Take 1 tablet by mouth every 6 (six) hours as needed for moderate pain or severe pain. Patient not taking: Reported on 10/17/2015 08/06/15   Earley FavorGail Schulz, NP  ibuprofen (ADVIL,MOTRIN) 800 MG tablet Take 1 tablet (800 mg total) by mouth 3 (three) times daily. 01/18/16   Marily MemosJason Moshe Wenger, MD  ipratropium (ATROVENT) 0.06 % nasal spray Place 2 sprays into both nostrils 4 (four) times daily. Patient not taking: Reported on 08/06/2015 06/22/15   Linna HoffJames D Kindl, MD  polyethylene glycol powder (GLYCOLAX/MIRALAX) powder Take 17 g by mouth daily. Patient not taking: Reported on 08/06/2015 05/12/15   Domenick GongAshley Mortenson, MD    Family History Family History  Problem Relation Age of Onset  . Depression Father   . Breast cancer Maternal Grandmother   . Arthritis/Rheumatoid  Maternal Grandmother   . Lupus Paternal Grandmother     Social History Social History  Substance Use Topics  . Smoking status: Never Smoker  . Smokeless tobacco: Never Used  . Alcohol use Yes     Comment: occasionally     Allergies   Review of patient's allergies indicates no known allergies.   Review of Systems Review of Systems  HENT: Positive for congestion and sneezing. Negative for rhinorrhea.   Respiratory: Positive for cough.   Cardiovascular: Positive for chest pain (with coughing).  Neurological: Negative for headaches.  All other systems reviewed and are negative.    Physical Exam Updated Vital Signs BP  129/81 (BP Location: Left Arm)   Pulse 72   Temp 97.8 F (36.6 C) (Oral)   Resp 16   SpO2 98%   Physical Exam  Constitutional: She is oriented to person, place, and time. She appears well-developed and well-nourished.  HENT:  Head: Normocephalic and atraumatic.  Mouth/Throat: Uvula is midline and oropharynx is clear and moist. Mucous membranes are dry. No oropharyngeal exudate, posterior oropharyngeal edema, posterior oropharyngeal erythema or tonsillar abscesses. No tonsillar exudate.  High pitched hoarse voice. Handling secretions.   Neck: Normal range of motion. Neck supple. No JVD present. No tracheal deviation present. No thyromegaly present.  Cardiovascular: Normal rate and regular rhythm.   No murmur heard. Pulmonary/Chest: Effort normal and breath sounds normal. No stridor. No respiratory distress. She has no wheezes.  Abdominal: Soft. She exhibits no distension.  Lymphadenopathy:    She has cervical adenopathy.  Neurological: She is alert and oriented to person, place, and time.  Skin: Skin is warm and dry.  Nursing note and vitals reviewed.    ED Treatments / Results  Labs (all labs ordered are listed, but only abnormal results are displayed) Labs Reviewed - No data to display  EKG  EKG Interpretation None       Radiology No results found.  Procedures Procedures (including critical care time)  Medications Ordered in ED Medications  dexamethasone (DECADRON) injection 10 mg (not administered)  benzonatate (TESSALON) capsule 100 mg (not administered)     Initial Impression / Assessment and Plan / ED Course  I have reviewed the triage vital signs and the nursing notes.  Pertinent labs & imaging results that were available during my care of the patient were reviewed by me and considered in my medical decision making (see chart for details).  Clinical Course   The patient hoarse voice and suspect likely laryngitis. Less likely to be epiglottitis or any  kind of airway impingement. Symptoms likely secondary to URI. Less than 24 hours symptoms so we'll start on Decadron and NSAIDs. Ask her to follow-up with her doctor in couple days.   Final Clinical Impressions(s) / ED Diagnoses   Final diagnoses:  Viral illness  Influenza-like illness    New Prescriptions New Prescriptions   BENZONATATE (TESSALON) 100 MG CAPSULE    Take 1 capsule (100 mg total) by mouth 3 (three) times daily as needed for cough.     Marily MemosJason Jalise Zawistowski, MD 01/18/16 [phone]

## 2016-01-18 NOTE — ED Notes (Signed)
MD at bedside. 

## 2016-04-26 IMAGING — CT CT ABD-PELV W/O CM
1 series · 15 of 23 positions shown, 19 images · non-contrast
Comparison: Pelvic ultrasound performed 04/16/2004

CLINICAL DATA: Acute onset of left flank pain starting last night,
radiating to the back. Initial encounter.

EXAM:
CT ABDOMEN AND PELVIS WITHOUT CONTRAST
TECHNIQUE: Multidetector CT imaging of the abdomen and pelvis was performed
following the standard protocol without IV contrast.

[Series 4: lung · axial · 0.79mm/px · z∈[-156,-56]mm · 15 of 23 slices shown, 19 images]
[im 2/23  soft-tissue]
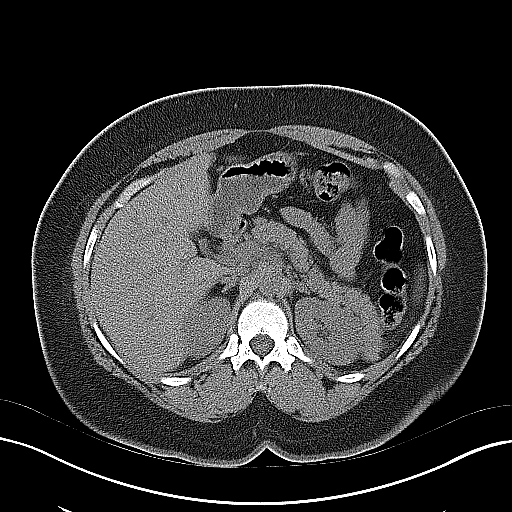
[im 2/23  bone]
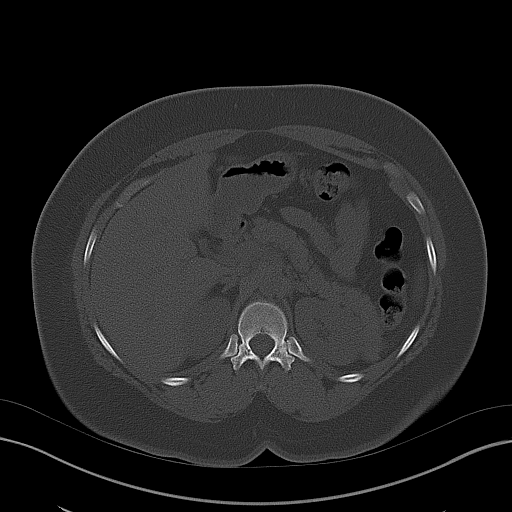
[im 4/23  soft-tissue]
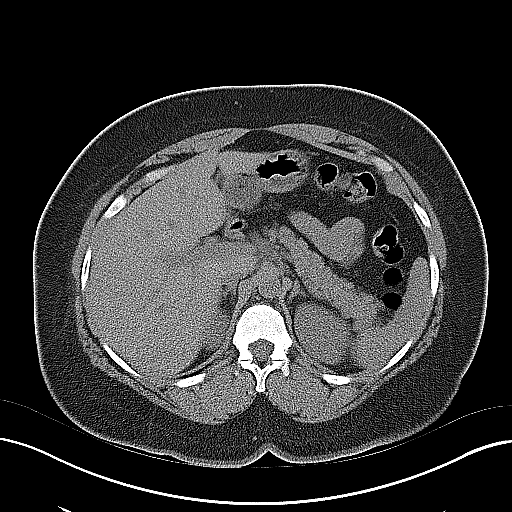
[im 6/23  soft-tissue]
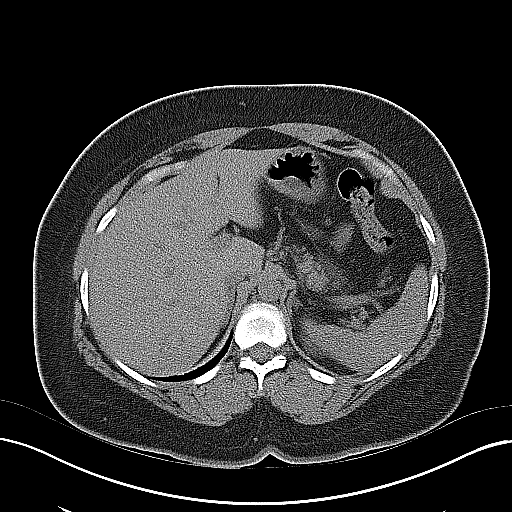
[im 7/23  soft-tissue]
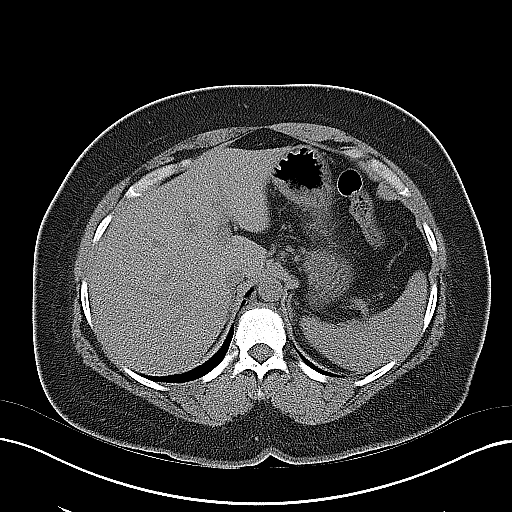
[im 9/23  soft-tissue]
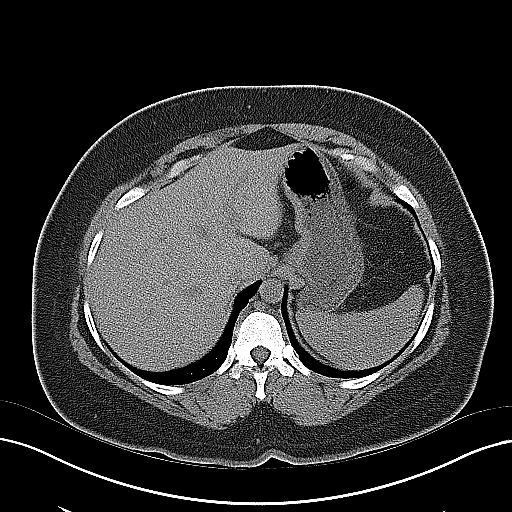
[im 10/23  soft-tissue]
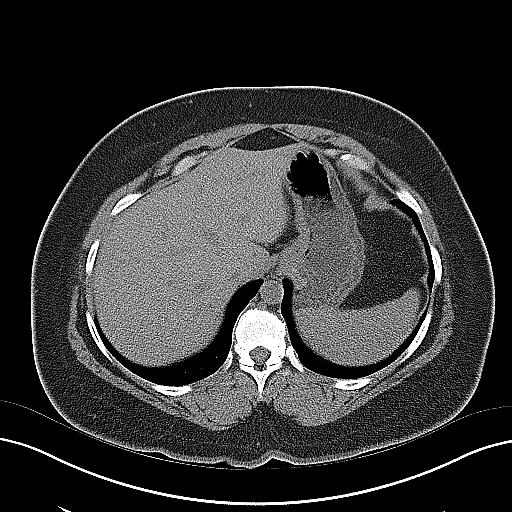
[im 12/23  soft-tissue]
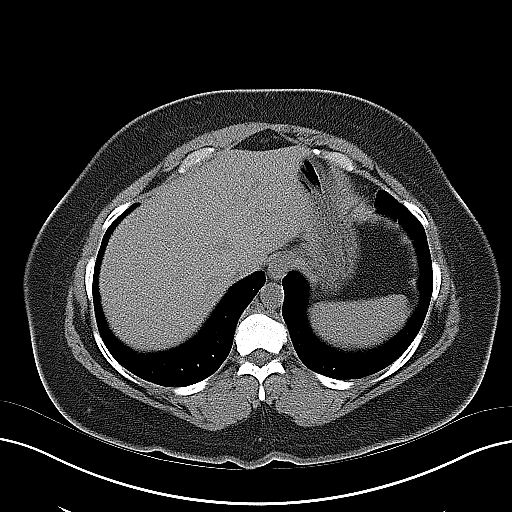
[im 14/23  soft-tissue]
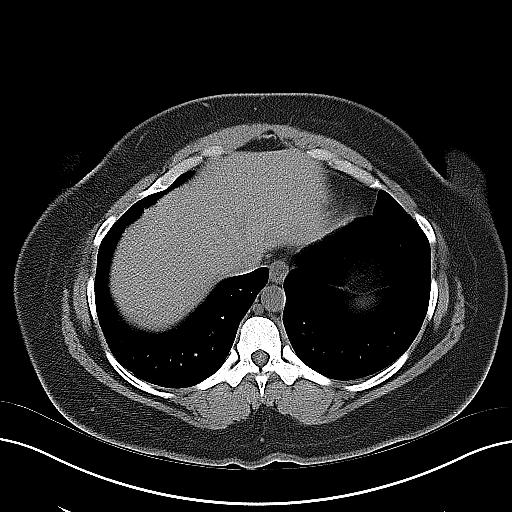
[im 15/23  soft-tissue]
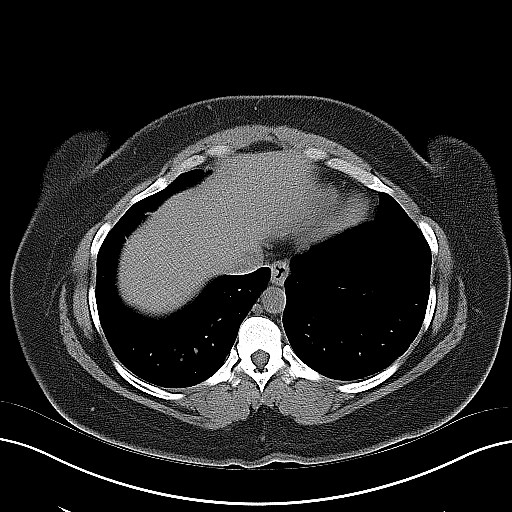
[im 15/23  bone]
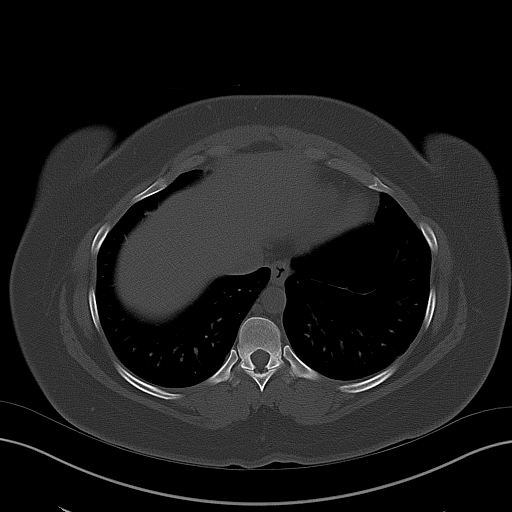
[im 17/23  soft-tissue]
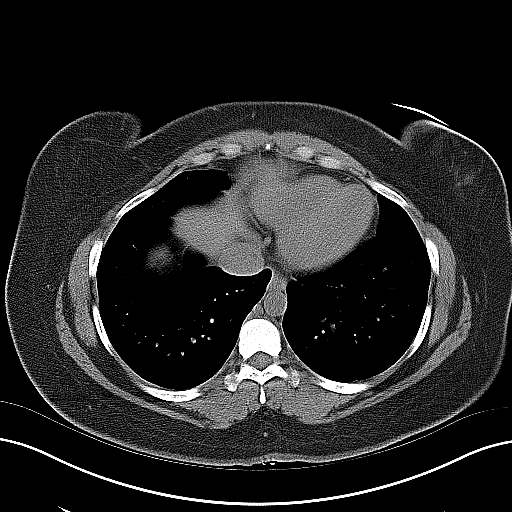
[im 18/23  soft-tissue]
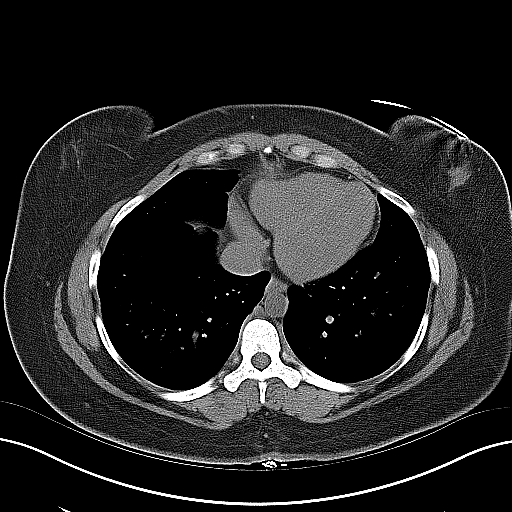
[im 19/23  lung]
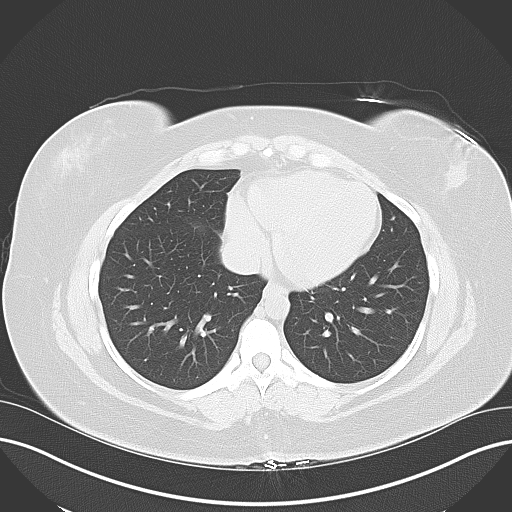
[im 20/23  soft-tissue]
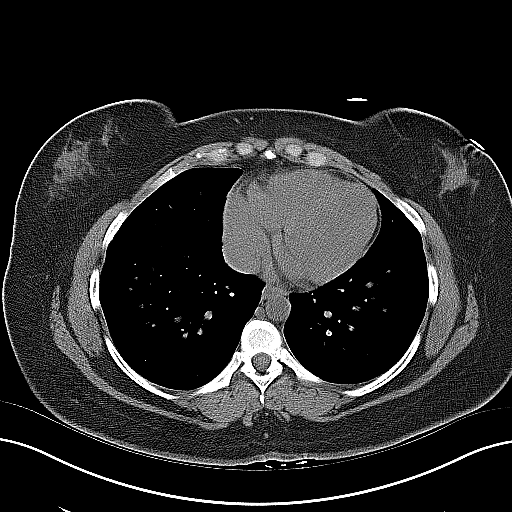
[im 20/23  lung]
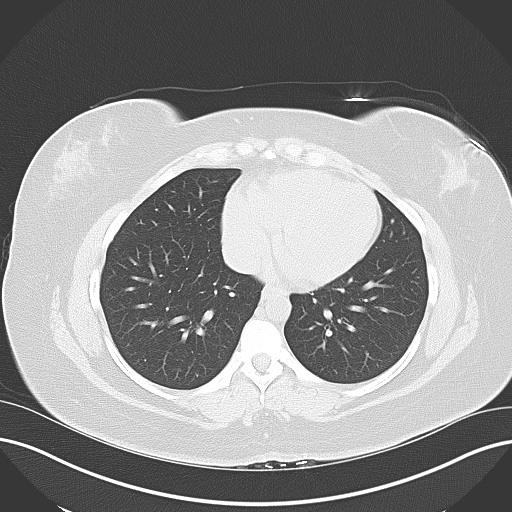
[im 21/23  lung]
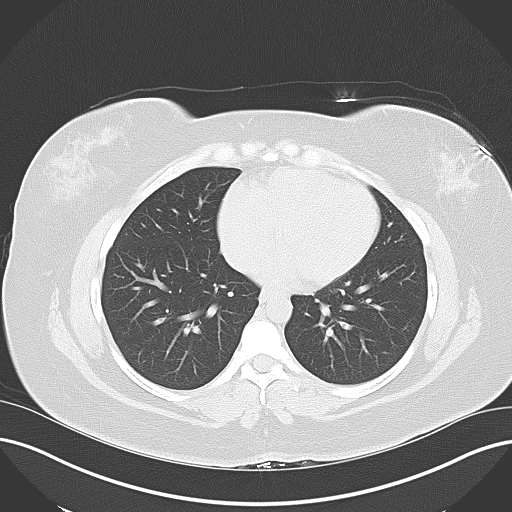
[im 22/23  soft-tissue]
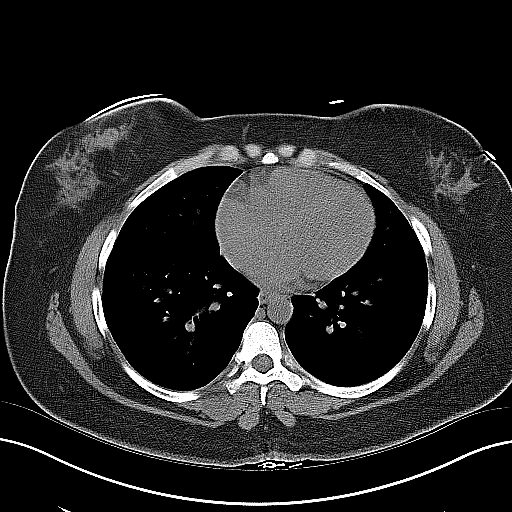
[im 22/23  lung]
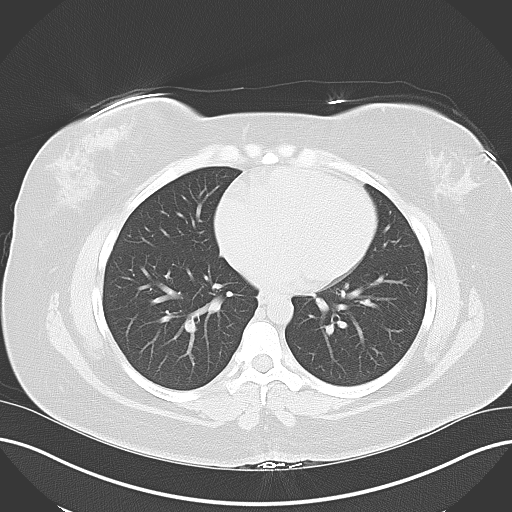

[15 of 23 positions shown; findings below may reference images not displayed]

FINDINGS: The visualized lung bases are clear.

The liver and spleen are unremarkable in appearance. The gallbladder
is within normal limits. The pancreas and adrenal glands are
unremarkable.

The kidneys are unremarkable in appearance. There is no evidence of
hydronephrosis. No renal or ureteral stones are seen. No perinephric
stranding is appreciated.

No free fluid is identified. The small bowel is unremarkable in
appearance. The stomach is within normal limits. No acute vascular
abnormalities are seen.

The appendix is normal in caliber, without evidence for
appendicitis. The colon is unremarkable in appearance.

The bladder is decompressed and not well assessed. The uterus is
within normal limits. The ovaries are relatively symmetric, though
difficult to fully assess. No suspicious adnexal masses are seen. No
inguinal lymphadenopathy is seen.

No acute osseous abnormalities are identified.
IMPRESSION: Unremarkable noncontrast CT of the abdomen and pelvis. No evidence
of hydronephrosis; no renal or ureteral stones seen.

## 2016-11-24 DIAGNOSIS — M797 Fibromyalgia: Secondary | ICD-10-CM | POA: Diagnosis not present

## 2016-12-14 DIAGNOSIS — F4321 Adjustment disorder with depressed mood: Secondary | ICD-10-CM | POA: Diagnosis not present

## 2016-12-14 DIAGNOSIS — M797 Fibromyalgia: Secondary | ICD-10-CM | POA: Diagnosis not present

## 2016-12-14 DIAGNOSIS — Z1389 Encounter for screening for other disorder: Secondary | ICD-10-CM | POA: Diagnosis not present

## 2016-12-14 DIAGNOSIS — I10 Essential (primary) hypertension: Secondary | ICD-10-CM | POA: Diagnosis not present

## 2017-01-13 DIAGNOSIS — I10 Essential (primary) hypertension: Secondary | ICD-10-CM | POA: Diagnosis not present

## 2017-01-13 DIAGNOSIS — Z131 Encounter for screening for diabetes mellitus: Secondary | ICD-10-CM | POA: Diagnosis not present

## 2017-01-13 DIAGNOSIS — Z23 Encounter for immunization: Secondary | ICD-10-CM | POA: Diagnosis not present

## 2017-01-13 DIAGNOSIS — R51 Headache: Secondary | ICD-10-CM | POA: Diagnosis not present

## 2017-01-13 DIAGNOSIS — Z13220 Encounter for screening for lipoid disorders: Secondary | ICD-10-CM | POA: Diagnosis not present

## 2017-01-13 DIAGNOSIS — Z1389 Encounter for screening for other disorder: Secondary | ICD-10-CM | POA: Diagnosis not present

## 2017-01-13 DIAGNOSIS — Z0000 Encounter for general adult medical examination without abnormal findings: Secondary | ICD-10-CM | POA: Diagnosis not present

## 2017-03-27 ENCOUNTER — Emergency Department (HOSPITAL_COMMUNITY)
Admission: EM | Admit: 2017-03-27 | Discharge: 2017-03-27 | Disposition: A | Discharge status: Home / Self Care | Payer: BLUE CROSS/BLUE SHIELD | Attending: Emergency Medicine | Admitting: Emergency Medicine

## 2017-03-27 ENCOUNTER — Encounter (HOSPITAL_COMMUNITY): Payer: Self-pay

## 2017-03-27 ENCOUNTER — Other Ambulatory Visit: Payer: Self-pay

## 2017-03-27 DIAGNOSIS — Z5321 Procedure and treatment not carried out due to patient leaving prior to being seen by health care provider: Secondary | ICD-10-CM | POA: Diagnosis not present

## 2017-03-27 DIAGNOSIS — R51 Headache: Secondary | ICD-10-CM | POA: Diagnosis not present

## 2017-03-27 NOTE — ED Triage Notes (Signed)
Patient c/o headache, left lower back pain, numbness and tingling to the hands and feet x 2-3 days ago. Patient denies any injury.

## 2017-03-27 NOTE — ED Notes (Signed)
Pt stated that wait is to long.  Informed RN.

## 2017-03-28 DIAGNOSIS — I10 Essential (primary) hypertension: Secondary | ICD-10-CM | POA: Diagnosis not present

## 2017-04-25 ENCOUNTER — Emergency Department (HOSPITAL_COMMUNITY)
Admission: EM | Admit: 2017-04-25 | Discharge: 2017-04-25 | Disposition: A | Discharge status: Home / Self Care | Payer: BLUE CROSS/BLUE SHIELD | Attending: Emergency Medicine | Admitting: Emergency Medicine

## 2017-04-25 ENCOUNTER — Emergency Department (HOSPITAL_COMMUNITY): Payer: BLUE CROSS/BLUE SHIELD

## 2017-04-25 DIAGNOSIS — Z79899 Other long term (current) drug therapy: Secondary | ICD-10-CM | POA: Diagnosis not present

## 2017-04-25 DIAGNOSIS — R109 Unspecified abdominal pain: Secondary | ICD-10-CM

## 2017-04-25 DIAGNOSIS — R1032 Left lower quadrant pain: Secondary | ICD-10-CM | POA: Insufficient documentation

## 2017-04-25 LAB — CBC
HCT: 34.5 % — ABNORMAL LOW (ref 36.0–46.0)
Hemoglobin: 10.8 g/dL — ABNORMAL LOW (ref 12.0–15.0)
MCH: 28.5 pg (ref 26.0–34.0)
MCHC: 31.3 g/dL (ref 30.0–36.0)
MCV: 91 fL (ref 78.0–100.0)
Platelets: 304 10*3/uL (ref 150–400)
RBC: 3.79 MIL/uL — ABNORMAL LOW (ref 3.87–5.11)
RDW: 13.4 % (ref 11.5–15.5)
WBC: 7.3 10*3/uL (ref 4.0–10.5)

## 2017-04-25 LAB — BASIC METABOLIC PANEL
Anion gap: 11 (ref 5–15)
BUN: 11 mg/dL (ref 6–20)
CO2: 23 mmol/L (ref 22–32)
Calcium: 9.4 mg/dL (ref 8.9–10.3)
Chloride: 103 mmol/L (ref 101–111)
Creatinine, Ser: 1.31 mg/dL — ABNORMAL HIGH (ref 0.44–1.00)
GFR calc Af Amer: 59 mL/min — ABNORMAL LOW (ref >60)
GFR calc non Af Amer: 51 mL/min — ABNORMAL LOW (ref >60)
Glucose, Bld: 105 mg/dL — ABNORMAL HIGH (ref 65–99)
Potassium: 4.3 mmol/L (ref 3.5–5.1)
Sodium: 137 mmol/L (ref 135–145)

## 2017-04-25 LAB — URINALYSIS, ROUTINE W REFLEX MICROSCOPIC
Bilirubin Urine: NEGATIVE
Glucose, UA: NEGATIVE mg/dL
Hgb urine dipstick: NEGATIVE
Ketones, ur: NEGATIVE mg/dL
Leukocytes, UA: NEGATIVE
Nitrite: NEGATIVE
Protein, ur: NEGATIVE mg/dL
Specific Gravity, Urine: 1.006 (ref 1.005–1.030)
pH: 7 (ref 5.0–8.0)

## 2017-04-25 LAB — I-STAT BETA HCG BLOOD, ED (MC, WL, AP ONLY): I-stat hCG, quantitative: <5 m[IU]/mL (ref <5)

## 2017-04-25 MED ORDER — TRAMADOL HCL 50 MG PO TABS
50.0000 mg | ORAL_TABLET | Freq: Once | ORAL | Status: AC
  Administered 2017-04-25: 50 mg via ORAL
  Filled 2017-04-25: qty 1

## 2017-04-25 MED ORDER — HYDROCODONE-ACETAMINOPHEN 5-325 MG PO TABS: 1.0000 | ORAL_TABLET | Freq: Four times a day (QID) | ORAL | 0 refills | Status: DC | PRN

## 2017-04-25 MED ORDER — OXYCODONE-ACETAMINOPHEN 5-325 MG PO TABS
1.0000 | ORAL_TABLET | ORAL | Status: AC | PRN
  Administered 2017-04-25 (×2): 1 via ORAL
  Filled 2017-04-25 (×2): qty 1

## 2017-04-25 MED ORDER — IBUPROFEN 400 MG PO TABS
400.0000 mg | ORAL_TABLET | Freq: Once | ORAL | Status: AC
  Administered 2017-04-25: 400 mg via ORAL
  Filled 2017-04-25: qty 1

## 2017-04-25 NOTE — ED Provider Notes (Signed)
[facility] EMERGENCY DEPARTMENT Provider Note   CSN: [id number] Arrival date & time: 04/25/17  0122     History   Chief Complaint Chief Complaint  Patient presents with  . Flank Pain    HPI Julie Schwartz is a 42 y.o. female.  Patient c/o acute onset severe left flank pain last night. Symptoms waxing and waning in severity, but constant, persistent. Hx similar symptoms in past but unsure of cause. No hx ovarian cysts, endometriosis or fibroids. No hx kidney stones. No hx diverticula/itis. No trauma/strain to area. No back pain. No radicular/leg pain. No fever or chills. Denies dysuria or hematuria.    The history is provided by the patient.  Flank Pain  Pertinent negatives include no chest pain, no abdominal pain, no headaches and no shortness of breath.    Past Medical History:  Diagnosis Date  . Anemia   . Depression   . Fibromyalgia   . Headache(784.0) 11/28/2012  . Iron deficiency anemia   . Migraine   . Myalgia   . Myalgia and myositis, unspecified 11/28/2012  . Obesity   . Panic attack     Patient Active Problem List   Diagnosis Date Noted  . Myalgia and myositis, unspecified 11/28/2012  . Headache(784.0) 11/28/2012  . Disturbance of skin sensation 11/28/2012    Past Surgical History:  Procedure Laterality Date  . CARPAL TUNNEL RELEASE Right   . CESAREAN SECTION    . TUBAL LIGATION      OB History    Gravida Para Term Preterm AB Living   2 2 2     2    SAB TAB Ectopic Multiple Live Births                   Home Medications    Prior to Admission medications   Medication Sig Start Date End Date Taking? Authorizing Provider  amitriptyline (ELAVIL) 50 MG tablet Take 1 tablet (50 mg total) by mouth 2 (two) times daily. Patient not taking: Reported on 10/17/2015 08/06/15 08/16/15  Earley Favor, NP  benzonatate (TESSALON) 100 MG capsule Take 1 capsule (100 mg total) by mouth 3 (three) times daily as needed for cough. 01/18/16    Mesner, Barbara Cower, MD  cyclobenzaprine (FLEXERIL) 5 MG tablet Take 1 tablet (5 mg total) by mouth 3 (three) times daily as needed for muscle spasms. Patient not taking: Reported on 10/17/2015 08/06/15   Earley Favor, NP  guaiFENesin-codeine [facility]) 100-10 MG/5ML syrup Take 10 mLs by mouth 4 (four) times daily as needed for cough. Patient not taking: Reported on 08/06/2015 06/22/15   Linna Hoff, MD  HYDROcodone-acetaminophen (NORCO/VICODIN) 5-325 MG tablet Take 1 tablet by mouth every 6 (six) hours as needed for moderate pain or severe pain. Patient not taking: Reported on 10/17/2015 08/06/15   Earley Favor, NP  ibuprofen (ADVIL,MOTRIN) 800 MG tablet Take 1 tablet (800 mg total) by mouth 3 (three) times daily. 01/18/16   Mesner, Barbara Cower, MD  ipratropium (ATROVENT) 0.06 % nasal spray Place 2 sprays into both nostrils 4 (four) times daily. Patient not taking: Reported on 08/06/2015 06/22/15   Linna Hoff, MD  polyethylene glycol powder (GLYCOLAX/MIRALAX) powder Take 17 g by mouth daily. Patient not taking: Reported on 08/06/2015 05/12/15   Domenick Gong, MD    Family History Family History  Problem Relation Age of Onset  . Depression Father   . Breast cancer Maternal Grandmother   . Arthritis/Rheumatoid Maternal Grandmother   . Lupus Paternal  Grandmother     Social History Social History   Tobacco Use  . Smoking status: Never Smoker  . Smokeless tobacco: Never Used  Substance Use Topics  . Alcohol use: Yes    Comment: occasionally  . Drug use: No     Allergies   Patient has no known allergies.   Review of Systems Review of Systems  Constitutional: Negative for fever.  HENT: Negative for sore throat.   Eyes: Negative for redness.  Respiratory: Negative for shortness of breath.   Cardiovascular: Negative for chest pain.  Gastrointestinal: Negative for abdominal pain and vomiting.  Genitourinary: Positive for flank pain. Negative for dysuria and vaginal discharge.    Musculoskeletal: Negative for back pain.  Skin: Negative for rash.  Neurological: Negative for weakness, numbness and headaches.  Hematological: Does not bruise/bleed easily.  Psychiatric/Behavioral: Negative for confusion.     Physical Exam Updated Vital Signs BP 135/88 (BP Location: Right Arm)   Pulse 76   Temp 98.7 F (37.1 C) (Oral)   Resp 20   Ht 1.753 m (5\' 9" )   Wt 104.3 kg (230 lb)   SpO2 100%   BMI 33.97 kg/m   Physical Exam  Constitutional: She appears well-developed and well-nourished. No distress.  HENT:  Head: Atraumatic.  Eyes: Conjunctivae are normal. No scleral icterus.  Neck: Neck supple. No tracheal deviation present.  Cardiovascular: Normal rate, regular rhythm, normal heart sounds and intact distal pulses.  Pulmonary/Chest: Effort normal and breath sounds normal. No respiratory distress.  Abdominal: Soft. Normal appearance and bowel sounds are normal. She exhibits no distension and no mass. There is no tenderness. There is no rebound and no guarding. No hernia.  Genitourinary:  Genitourinary Comments: No cva tenderness  Musculoskeletal: She exhibits no edema.  LS spine non tender, aligned.   Neurological: She is alert.  Motor intact bil legs, stre 5/5. sens grossly intact. Steady gait.   Skin: Skin is warm and dry. No rash noted. She is not diaphoretic.  No rash or skin lesions in area of pain  Psychiatric: She has a normal mood and affect.  Nursing note and vitals reviewed.    ED Treatments / Results  Labs (all labs ordered are listed, but only abnormal results are displayed) Results for orders placed or performed during the hospital encounter of 04/25/17  Urinalysis, Routine w reflex microscopic- may I&O cath if menses  Result Value Ref Range   Color, Urine STRAW (A) YELLOW   APPearance CLEAR CLEAR   Specific Gravity, Urine 1.006 1.005 - 1.030   pH 7.0 5.0 - 8.0   Glucose, UA NEGATIVE NEGATIVE mg/dL   Hgb urine dipstick NEGATIVE NEGATIVE    Bilirubin Urine NEGATIVE NEGATIVE   Ketones, ur NEGATIVE NEGATIVE mg/dL   Protein, ur NEGATIVE NEGATIVE mg/dL   Nitrite NEGATIVE NEGATIVE   Leukocytes, UA NEGATIVE NEGATIVE  Basic metabolic panel  Result Value Ref Range   Sodium 137 135 - 145 mmol/L   Potassium 4.3 3.5 - 5.1 mmol/L   Chloride 103 101 - 111 mmol/L   CO2 23 22 - 32 mmol/L   Glucose, Bld 105 (H) 65 - 99 mg/dL   BUN 11 6 - 20 mg/dL   Creatinine, Ser 1.61 (H) 0.44 - 1.00 mg/dL   Calcium 9.4 8.9 - 09.6 mg/dL   GFR calc non Af Amer 51 (L) >60 mL/min   GFR calc Af Amer 59 (L) >60 mL/min   Anion gap 11 5 - 15  CBC  Result Value  Ref Range   WBC 7.3 4.0 - 10.5 K/uL   RBC 3.79 (L) 3.87 - 5.11 MIL/uL   Hemoglobin 10.8 (L) 12.0 - 15.0 g/dL   HCT 98.134.5 (L) 19.136.0 - 47.846.0 %   MCV 91.0 78.0 - 100.0 fL   MCH 28.5 26.0 - 34.0 pg   MCHC 31.3 30.0 - 36.0 g/dL   RDW 29.513.4 62.111.5 - 30.815.5 %   Platelets 304 150 - 400 K/uL  I-Stat beta hCG blood, ED  Result Value Ref Range   I-stat hCG, quantitative <5.0 <5 mIU/mL   Comment 3           Ct Renal Stone Study  Result Date: 04/25/2017 CLINICAL DATA:  Left flank pain for 12 hours EXAM: CT ABDOMEN AND PELVIS WITHOUT CONTRAST TECHNIQUE: Multidetector CT imaging of the abdomen and pelvis was performed following the standard protocol without IV contrast. COMPARISON:  06/05/2015 FINDINGS: Lower chest:  No contributory findings. Hepatobiliary: No focal liver abnormality.No evidence of biliary obstruction or stone. Pancreas: Unremarkable. Spleen: Unremarkable. Adrenals/Urinary Tract: Negative adrenals. No hydronephrosis or stone. Unremarkable bladder. Stomach/Bowel:  No obstruction. No appendicitis. Vascular/Lymphatic: No acute vascular abnormality. No mass or adenopathy. Reproductive:No pathologic findings. Other: No ascites or pneumoperitoneum. Musculoskeletal: No acute abnormalities. IMPRESSION: Negative exam.  No explanation for flank pain Electronically Signed   By: Marnee SpringJonathon  Watts M.D.   On:  04/25/2017 08:23    EKG  EKG Interpretation None       Radiology Ct Renal Stone Study  Result Date: 04/25/2017 CLINICAL DATA:  Left flank pain for 12 hours EXAM: CT ABDOMEN AND PELVIS WITHOUT CONTRAST TECHNIQUE: Multidetector CT imaging of the abdomen and pelvis was performed following the standard protocol without IV contrast. COMPARISON:  06/05/2015 FINDINGS: Lower chest:  No contributory findings. Hepatobiliary: No focal liver abnormality.No evidence of biliary obstruction or stone. Pancreas: Unremarkable. Spleen: Unremarkable. Adrenals/Urinary Tract: Negative adrenals. No hydronephrosis or stone. Unremarkable bladder. Stomach/Bowel:  No obstruction. No appendicitis. Vascular/Lymphatic: No acute vascular abnormality. No mass or adenopathy. Reproductive:No pathologic findings. Other: No ascites or pneumoperitoneum. Musculoskeletal: No acute abnormalities. IMPRESSION: Negative exam.  No explanation for flank pain Electronically Signed   By: Marnee SpringJonathon  Watts M.D.   On: 04/25/2017 08:23    Procedures Procedures (including critical care time)  Medications Ordered in ED Medications  ibuprofen (ADVIL,MOTRIN) tablet 400 mg (not administered)  traMADol (ULTRAM) tablet 50 mg (not administered)  oxyCODONE-acetaminophen (PERCOCET/ROXICET) 5-325 MG per tablet 1 tablet (1 tablet Oral Given 04/25/17 0239)     Initial Impression / Assessment and Plan / ED Course  I have reviewed the triage vital signs and the nursing notes.  Pertinent labs & imaging results that were available during my care of the patient were reviewed by me and considered in my medical decision making (see chart for details).  Ultram po. Motrin po.  w acute flank pain - ct ordered.  Ct neg acute.    Reviewed nursing notes and prior charts for additional history. Of note, in past, pt with 2 prior imaging studies for acute left flank pain - also negative.  ?whether possibly musculoskeletal in etiology, nerve impingement,  etc.  Recheck, pt more comfortable appearing. Vitals normal. Pt appears stable for d/c.     Final Clinical Impressions(s) / ED Diagnoses   Final diagnoses:  None    ED Discharge Orders    None       Cathren LaineSteinl, Nosson Wender, MD 04/25/17 [phone]

## 2017-04-25 NOTE — Discharge Instructions (Signed)
It was our pleasure to provide your ER care today - we hope that you feel better.  Take motrin or aleve as need for pain. You may also take hydrocodone as need for pain. No driving for the next 6 hours or when taking hydrocodone. Also, do not take tylenol or acetaminophen containing medication when taking hydrocodone.  Follow up with primary care doctor in the coming week.   Return to ER if worse, new symptoms, fevers, intractable pain, other concern.

## 2017-04-25 NOTE — ED Triage Notes (Signed)
Pt reports L flank pain onset in the evening, denies GU S/S, N/V/D. Denies hx of kidney stones.

## 2017-04-25 NOTE — ED Notes (Signed)
Pt states her pain still has not changed after the 2 Percocet

## 2017-04-25 NOTE — ED Notes (Signed)
Pt states pain has not changed.

## 2017-05-09 ENCOUNTER — Other Ambulatory Visit: Payer: Self-pay | Admitting: Gastroenterology

## 2017-05-09 DIAGNOSIS — R1032 Left lower quadrant pain: Secondary | ICD-10-CM

## 2017-05-09 DIAGNOSIS — M25561 Pain in right knee: Secondary | ICD-10-CM | POA: Diagnosis not present

## 2017-05-09 DIAGNOSIS — M797 Fibromyalgia: Secondary | ICD-10-CM | POA: Diagnosis not present

## 2017-05-19 ENCOUNTER — Ambulatory Visit
Admission: RE | Admit: 2017-05-19 | Discharge: 2017-05-19 | Disposition: A | Discharge status: Home / Self Care | Payer: BLUE CROSS/BLUE SHIELD | Source: Ambulatory Visit | Attending: Gastroenterology | Admitting: Gastroenterology

## 2017-05-19 DIAGNOSIS — R1032 Left lower quadrant pain: Secondary | ICD-10-CM | POA: Diagnosis not present

## 2017-05-19 MED ORDER — IOPAMIDOL (ISOVUE-300) INJECTION 61%
125.0000 mL | Freq: Once | INTRAVENOUS | Status: AC | PRN
  Administered 2017-05-19: 125 mL via INTRAVENOUS

## 2017-05-24 ENCOUNTER — Other Ambulatory Visit: Payer: Self-pay | Admitting: Obstetrics and Gynecology

## 2017-05-24 ENCOUNTER — Other Ambulatory Visit (HOSPITAL_COMMUNITY)
Admission: RE | Admit: 2017-05-24 | Discharge: 2017-05-24 | Disposition: A | Discharge status: Home / Self Care | Payer: BLUE CROSS/BLUE SHIELD | Source: Ambulatory Visit | Attending: Obstetrics and Gynecology | Admitting: Obstetrics and Gynecology

## 2017-05-24 DIAGNOSIS — Z124 Encounter for screening for malignant neoplasm of cervix: Secondary | ICD-10-CM | POA: Diagnosis not present

## 2017-05-24 DIAGNOSIS — N898 Other specified noninflammatory disorders of vagina: Secondary | ICD-10-CM | POA: Diagnosis not present

## 2017-05-24 DIAGNOSIS — Z01411 Encounter for gynecological examination (general) (routine) with abnormal findings: Secondary | ICD-10-CM | POA: Diagnosis not present

## 2017-05-24 DIAGNOSIS — R1032 Left lower quadrant pain: Secondary | ICD-10-CM | POA: Diagnosis not present

## 2017-05-26 LAB — CYTOLOGY - PAP
Diagnosis: NEGATIVE
HPV: NOT DETECTED

## 2017-06-01 DIAGNOSIS — R1032 Left lower quadrant pain: Secondary | ICD-10-CM | POA: Diagnosis not present

## 2017-06-01 DIAGNOSIS — M797 Fibromyalgia: Secondary | ICD-10-CM | POA: Diagnosis not present

## 2017-06-19 DIAGNOSIS — K529 Noninfective gastroenteritis and colitis, unspecified: Secondary | ICD-10-CM | POA: Diagnosis not present

## 2017-06-19 DIAGNOSIS — R197 Diarrhea, unspecified: Secondary | ICD-10-CM | POA: Diagnosis not present

## 2017-06-20 DIAGNOSIS — R197 Diarrhea, unspecified: Secondary | ICD-10-CM | POA: Diagnosis not present

## 2017-10-02 ENCOUNTER — Emergency Department (HOSPITAL_COMMUNITY)
Admission: EM | Admit: 2017-10-02 | Discharge: 2017-10-03 | Discharge status: Against Medical Advice | Payer: BLUE CROSS/BLUE SHIELD | Attending: Emergency Medicine | Admitting: Emergency Medicine

## 2017-10-02 ENCOUNTER — Encounter (HOSPITAL_COMMUNITY): Payer: Self-pay | Admitting: Emergency Medicine

## 2017-10-02 ENCOUNTER — Emergency Department (HOSPITAL_COMMUNITY): Payer: BLUE CROSS/BLUE SHIELD

## 2017-10-02 ENCOUNTER — Other Ambulatory Visit: Payer: Self-pay

## 2017-10-02 DIAGNOSIS — R110 Nausea: Secondary | ICD-10-CM | POA: Diagnosis not present

## 2017-10-02 DIAGNOSIS — R109 Unspecified abdominal pain: Secondary | ICD-10-CM | POA: Insufficient documentation

## 2017-10-02 DIAGNOSIS — R1032 Left lower quadrant pain: Secondary | ICD-10-CM | POA: Diagnosis not present

## 2017-10-02 DIAGNOSIS — K5900 Constipation, unspecified: Secondary | ICD-10-CM | POA: Diagnosis not present

## 2017-10-02 DIAGNOSIS — R1110 Vomiting, unspecified: Secondary | ICD-10-CM | POA: Diagnosis not present

## 2017-10-02 LAB — URINALYSIS, ROUTINE W REFLEX MICROSCOPIC
Bilirubin Urine: NEGATIVE
Glucose, UA: NEGATIVE mg/dL
Hgb urine dipstick: NEGATIVE
Ketones, ur: NEGATIVE mg/dL
Leukocytes, UA: NEGATIVE
Nitrite: NEGATIVE
Protein, ur: NEGATIVE mg/dL
Specific Gravity, Urine: 1.021 (ref 1.005–1.030)
pH: 5 (ref 5.0–8.0)

## 2017-10-02 LAB — COMPREHENSIVE METABOLIC PANEL
ALT: 14 U/L (ref 0–44)
AST: 20 U/L (ref 15–41)
Albumin: 3.6 g/dL (ref 3.5–5.0)
Alkaline Phosphatase: 42 U/L (ref 38–126)
Anion gap: 6 (ref 5–15)
BUN: 11 mg/dL (ref 6–20)
CO2: 26 mmol/L (ref 22–32)
Calcium: 9 mg/dL (ref 8.9–10.3)
Chloride: 110 mmol/L (ref 98–111)
Creatinine, Ser: 1.07 mg/dL — ABNORMAL HIGH (ref 0.44–1.00)
GFR calc Af Amer: >60 mL/min (ref >60)
GFR calc non Af Amer: >60 mL/min (ref >60)
Glucose, Bld: 103 mg/dL — ABNORMAL HIGH (ref 70–99)
Potassium: 4.1 mmol/L (ref 3.5–5.1)
Sodium: 142 mmol/L (ref 135–145)
Total Bilirubin: 0.5 mg/dL (ref 0.3–1.2)
Total Protein: 7.2 g/dL (ref 6.5–8.1)

## 2017-10-02 LAB — CBC WITH DIFFERENTIAL/PLATELET
Abs Immature Granulocytes: 0 10*3/uL (ref 0.0–0.1)
Basophils Absolute: 0 10*3/uL (ref 0.0–0.1)
Basophils Relative: 1 %
Eosinophils Absolute: 0.1 10*3/uL (ref 0.0–0.7)
Eosinophils Relative: 1 %
HCT: 33.8 % — ABNORMAL LOW (ref 36.0–46.0)
Hemoglobin: 10.2 g/dL — ABNORMAL LOW (ref 12.0–15.0)
Immature Granulocytes: 0 %
Lymphocytes Relative: 55 %
Lymphs Abs: 3.5 10*3/uL (ref 0.7–4.0)
MCH: 28.1 pg (ref 26.0–34.0)
MCHC: 30.2 g/dL (ref 30.0–36.0)
MCV: 93.1 fL (ref 78.0–100.0)
Monocytes Absolute: 0.5 10*3/uL (ref 0.1–1.0)
Monocytes Relative: 8 %
Neutro Abs: 2.2 10*3/uL (ref 1.7–7.7)
Neutrophils Relative %: 35 %
Platelets: 296 10*3/uL (ref 150–400)
RBC: 3.63 MIL/uL — ABNORMAL LOW (ref 3.87–5.11)
RDW: 13.2 % (ref 11.5–15.5)
WBC: 6.3 10*3/uL (ref 4.0–10.5)

## 2017-10-02 LAB — LIPASE, BLOOD: Lipase: 46 U/L (ref 11–51)

## 2017-10-02 LAB — PREGNANCY, URINE: Preg Test, Ur: NEGATIVE

## 2017-10-02 MED ORDER — IOHEXOL 300 MG/ML  SOLN
100.0000 mL | Freq: Once | INTRAMUSCULAR | Status: AC | PRN
  Administered 2017-10-02: 100 mL via INTRAVENOUS

## 2017-10-02 NOTE — ED Triage Notes (Signed)
Pt reports that she has had left flank pain that radiates to her back and abdomen since friday. States she had a similar pain in February and was diagnosed with diverticulous and this pain feels similar. States she hasn't had bowel movement since Wednesday. Reports nausea but denies vomiting. Reports a fever of 102 around 12 today and took motrin.

## 2017-10-02 NOTE — ED Provider Notes (Signed)
Patient placed in Quick Look pathway, seen and evaluated   Chief Complaint: Abdominal pain   HPI:   42 year old female presenting with abdominal pain and constipation, onset 3 days ago. She has nausea. Temperature of 102 this afternoon and took motrin at noon. Last BM was 6 days ago. She is established with GI. H/o of diverticulitis.   ROS: Abdominal pain (one)  Physical Exam:   Gen: No distress  Neuro: Awake and Alert  Skin: Warm    Focused Exam: TTP LLQ. No rebound or guarding.    Initiation of care has begun. The patient has been counseled on the process, plan, and necessity for staying for the completion/evaluation, and the remainder of the medical screening examination    Barkley BoardsMcDonald, Elloise Roark A, PA-C 10/02/17 1807    Gwyneth SproutPlunkett, Whitney, MD 10/03/17 [phone]

## 2017-10-02 NOTE — ED Notes (Signed)
Pt very upset that she has not been seen by provider yet.  RN attempted several times to redirect pt and reassure her however she demanded that RN remove her IV so that she could leave.

## 2017-10-03 NOTE — ED Notes (Signed)
Follow up call made  No answer  10/03/17  0954 s Tobby Fawcett rn

## 2017-11-30 DIAGNOSIS — M797 Fibromyalgia: Secondary | ICD-10-CM | POA: Diagnosis not present

## 2017-12-15 DIAGNOSIS — M797 Fibromyalgia: Secondary | ICD-10-CM | POA: Diagnosis not present

## 2017-12-15 DIAGNOSIS — E669 Obesity, unspecified: Secondary | ICD-10-CM | POA: Diagnosis not present

## 2018-02-21 ENCOUNTER — Other Ambulatory Visit: Payer: Self-pay | Admitting: Internal Medicine

## 2018-02-21 DIAGNOSIS — Z23 Encounter for immunization: Secondary | ICD-10-CM | POA: Diagnosis not present

## 2018-02-21 DIAGNOSIS — Z1389 Encounter for screening for other disorder: Secondary | ICD-10-CM | POA: Diagnosis not present

## 2018-02-21 DIAGNOSIS — Z0000 Encounter for general adult medical examination without abnormal findings: Secondary | ICD-10-CM | POA: Diagnosis not present

## 2018-02-21 DIAGNOSIS — M797 Fibromyalgia: Secondary | ICD-10-CM | POA: Diagnosis not present

## 2018-02-21 DIAGNOSIS — R202 Paresthesia of skin: Secondary | ICD-10-CM | POA: Diagnosis not present

## 2018-02-21 DIAGNOSIS — D649 Anemia, unspecified: Secondary | ICD-10-CM | POA: Diagnosis not present

## 2018-02-21 DIAGNOSIS — Z1231 Encounter for screening mammogram for malignant neoplasm of breast: Secondary | ICD-10-CM

## 2018-02-21 DIAGNOSIS — R7309 Other abnormal glucose: Secondary | ICD-10-CM | POA: Diagnosis not present

## 2018-02-21 DIAGNOSIS — I10 Essential (primary) hypertension: Secondary | ICD-10-CM | POA: Diagnosis not present

## 2018-02-21 DIAGNOSIS — Z13220 Encounter for screening for lipoid disorders: Secondary | ICD-10-CM | POA: Diagnosis not present

## 2018-02-23 ENCOUNTER — Encounter: Payer: Self-pay | Admitting: Neurology

## 2018-02-23 ENCOUNTER — Ambulatory Visit (INDEPENDENT_AMBULATORY_CARE_PROVIDER_SITE_OTHER): Payer: BLUE CROSS/BLUE SHIELD | Admitting: Neurology

## 2018-02-23 VITALS — BP 140/90 | HR 71 | Ht 69.0 in | Wt 234.4 lb

## 2018-02-23 DIAGNOSIS — R202 Paresthesia of skin: Secondary | ICD-10-CM

## 2018-02-23 NOTE — Progress Notes (Signed)
[facility] Clinic Note - Initial Visit   Date: 02/23/18  SKYLINN Schwartz MRN: [id number] DOB: 1975-11-22   Dear Dr. Donette Larry:  Thank you for your kind referral of Julie Schwartz for consultation of disturbance of skin sensation. Although her history is well known to you, please allow [place] to reiterate it for the purpose of our medical record. The patient was accompanied to the clinic by self.    History of Present Illness: Julie Schwartz is a 42 y.o. right-handed African American female with hypertension, right CTS s/p release and fibromyalgia presenting for evaluation of disturbance of skin sensation.    Starting around mid-November 2019, she began having episodic numbness throughout the body, occurring over the arms, hands, thighs, legs, and feet.  Symptoms can last for a few minutes to 30 minutes.  It can occur at rest or with exertion. Nothing improves these spells and there is no trigger.   No preceding illness or stress. She also complains of weakness in the hands and legs.  She had extensive evaluation by Dr. Anne Hahn at [facility] in 2014 for myalgias and paresthesias including MRI brain, NCS/EMG of the right side, and CSF testing which was normal.  She had elevated CK which was followed and returned normal. She was diagnosed with fibromyalgia and takes gabapentin 300mg  at bedtime and amitptryline 25mg  at bedtime.    Out-side paper records, electronic medical record, and images have been reviewed where available and summarized as:  Labs 02/21/2018:  Vitamin D 7.0*, TSH 0.66, vitamin B12 219, HbA1c 5.0  NCS/EMG of the right arm and leg 12/20/2012:  Normal  MRI brain 07/19/2014:  Abnormal MRI scan of brain showing nonspecific left parietal periventricular and bilateral subcortical white matter hyperintensities who with the differential stated above. Direct comparison with prior films when available will be beneficial to comment on interval change.  MRI  cervical spine 12/20/2012:  IMPRESSION: Abnormal MRI scan of the brain showing large central disc herniation at C5-6 but without significant compression.  CSF 12/31/2012:  W2 R0  G54  P29  VDRL neg, no OCB, MBP <2.0, ACE 7, crypytococcus neg, HSV neg  Past Medical History:  Diagnosis Date  . Anemia   . Depression   . Fibromyalgia   . Headache(784.0) 11/28/2012  . Iron deficiency anemia   . Migraine   . Myalgia   . Myalgia and myositis, unspecified 11/28/2012  . Obesity   . Panic attack     Past Surgical History:  Procedure Laterality Date  . CARPAL TUNNEL RELEASE Right   . CESAREAN SECTION    . TUBAL LIGATION       Medications:  Outpatient Encounter Medications as of 02/23/2018  Medication Sig  . amitriptyline (ELAVIL) 25 MG tablet TK 2 TS PO HS  . cyclobenzaprine (FLEXERIL) 5 MG tablet Take 1 tablet (5 mg total) by mouth 3 (three) times daily as needed for muscle spasms.  Marland Kitchen gabapentin (NEURONTIN) 300 MG capsule TK 1 C PO UP TO QID  . ibuprofen (ADVIL,MOTRIN) 800 MG tablet Take 1 tablet (800 mg total) by mouth 3 (three) times daily.  . [DISCONTINUED] amitriptyline (ELAVIL) 50 MG tablet Take 1 tablet (50 mg total) by mouth 2 (two) times daily. (Patient not taking: Reported on 10/17/2015)  . [DISCONTINUED] benzonatate (TESSALON) 100 MG capsule Take 1 capsule (100 mg total) by mouth 3 (three) times daily as needed for cough.  . [DISCONTINUED] guaiFENesin-codeine (ROBITUSSIN AC) 100-10 MG/5ML syrup Take 10 mLs by mouth 4 (  four) times daily as needed for cough. (Patient not taking: Reported on 08/06/2015)  . [DISCONTINUED] HYDROcodone-acetaminophen (NORCO/VICODIN) 5-325 MG tablet Take 1 tablet by mouth every 6 (six) hours as needed for moderate pain or severe pain. (Patient not taking: Reported on 10/17/2015)  . [DISCONTINUED] HYDROcodone-acetaminophen (NORCO/VICODIN) 5-325 MG tablet Take 1-2 tablets by mouth every 6 (six) hours as needed for moderate pain.  . [DISCONTINUED] ipratropium  (ATROVENT) 0.06 % nasal spray Place 2 sprays into both nostrils 4 (four) times daily. (Patient not taking: Reported on 08/06/2015)  . [DISCONTINUED] polyethylene glycol powder (GLYCOLAX/MIRALAX) powder Take 17 g by mouth daily. (Patient not taking: Reported on 08/06/2015)   No facility-administered encounter medications on file as of 02/23/2018.      Allergies: No Known Allergies  Family History: Family History  Problem Relation Age of Onset  . Depression Father   . Breast cancer Maternal Grandmother   . Arthritis/Rheumatoid Maternal Grandmother   . Lupus Paternal Grandmother     Social History: Social History   Tobacco Use  . Smoking status: Never Smoker  . Smokeless tobacco: Never Used  Substance Use Topics  . Alcohol use: Yes    Comment: occasionally  . Drug use: No   Social History   Social History Narrative   Lives with husband and children in a one story home.  Works as a Scientific laboratory technician.  Education: 2 college degrees.     Review of Systems:  CONSTITUTIONAL: No fevers, chills, night sweats, or weight loss.   EYES: No visual changes or eye pain ENT: No hearing changes.  No history of nose bleeds.   RESPIRATORY: No cough, wheezing and shortness of breath.   CARDIOVASCULAR: Negative for chest pain, and palpitations.   GI: Negative for abdominal discomfort, blood in stools or black stools.  No recent change in bowel habits.   GU:  No history of incontinence.   MUSCLOSKELETAL: No history of joint pain or swelling.  No myalgias.   SKIN: Negative for lesions, rash, and itching.   HEMATOLOGY/ONCOLOGY: Negative for prolonged bleeding, bruising easily, and swollen nodes.  No history of cancer.   ENDOCRINE: Negative for cold or heat intolerance, polydipsia or goiter.   PSYCH:  No depression or anxiety symptoms.   NEURO: As Above.   Vital Signs:  BP 140/90   Pulse 71   Ht 5\' 9"  (1.753 m)   Wt 234 lb 6 oz (106.3 kg)   SpO2 100%   BMI 34.61 kg/m    General Medical  Exam:   General:  Well appearing, comfortable.   Eyes/ENT: see cranial nerve examination.   Neck: No masses appreciated.  Full range of motion without tenderness.  No carotid bruits. Respiratory:  Clear to auscultation, good air entry bilaterally.   Cardiac:  Regular rate and rhythm, no murmur.   Extremities:  No deformities, edema, or skin discoloration.  Skin:  No rashes or lesions.  Neurological Exam: MENTAL STATUS including orientation to time, place, person, recent and remote memory, attention span and concentration, language, and fund of knowledge is normal.  Speech is not dysarthric.  CRANIAL NERVES: II:  No visual field defects.  Unremarkable fundi.   III-IV-VI: Pupils equal round and reactive to light.  Normal conjugate, extra-ocular eye movements in all directions of gaze.  No nystagmus.  No ptosis.   V:  Normal facial sensation.   VII:  Normal facial symmetry and movements.  No pathologic facial reflexes.  VIII:  Normal hearing and vestibular function.  IX-X:  Normal palatal movement.   XI:  Normal shoulder shrug and head rotation.   XII:  Normal tongue strength and range of motion, no deviation or fasciculation.  MOTOR:  No atrophy, fasciculations or abnormal movements.  No pronator drift.  Tone is normal.    Right Upper Extremity:    Left Upper Extremity:    Deltoid  5/5   Deltoid  5/5   Biceps  5/5   Biceps  5/5   Triceps  5/5   Triceps  5/5   Wrist extensors  5/5   Wrist extensors  5/5   Wrist flexors  5/5   Wrist flexors  5/5   Finger extensors  5/5   Finger extensors  5/5   Finger flexors  5/5   Finger flexors  5/5   Dorsal interossei  5/5   Dorsal interossei  5/5   Abductor pollicis  5/5   Abductor pollicis  5/5   Tone (Ashworth scale)  0  Tone (Ashworth scale)  0   Right Lower Extremity:    Left Lower Extremity:    Hip flexors  5/5   Hip flexors  5/5   Hip extensors  5/5   Hip extensors  5/5   Knee flexors  5/5   Knee flexors  5/5   Knee extensors  5/5    Knee extensors  5/5   Dorsiflexors  5/5   Dorsiflexors  5/5   Plantarflexors  5/5   Plantarflexors  5/5   Toe extensors  5/5   Toe extensors  5/5   Toe flexors  5/5   Toe flexors  5/5   Tone (Ashworth scale)  0  Tone (Ashworth scale)  0   MSRs:  Right                                                                 Left brachioradialis 2+  brachioradialis 2+  biceps 2+  biceps 2+  triceps 2+  triceps 2+  patellar 2+  patellar 2+  ankle jerk 2+  ankle jerk 2+  Hoffman no  Hoffman no  plantar response down  plantar response down   SENSORY:  Temperature is normal throughout. She reports lack of vibration and pin prick diffusely in the arms and legs, vibration is normal at the forehead. Sensation is normal over the back, no sensory level.  Rhomberg sign is normal  COORDINATION/GAIT: Normal finger-to- nose-finger and heel-to-shin.  Intact rapid alternating movements bilaterally.  Able to rise from a chair without using arms.  Gait narrow based and stable. Tandem and stressed gait intact.    IMPRESSION: Migratory paresthesias most likely due to low vitamin B12.  Her exam shows subjective sensory loss to vibration and pinprick diffusely in the arms and legs, which is too widespread for atypical neuropathy especially when her reflexes are preserved.  Motor strength is intact throughout.  I have reassured her that the likelihood of anything worrisome is very low, especially as hand paresthesias do not conform to the cutaneous nerve or dermatomal distribution.  I have encouraged her to start vitamin B12 1000 mcg daily.  She would like to proceed with electrodiagnostic testing of the right arm and leg.  She has had this done previously in 2014, which was normal.  Further recommendations based on the above results.  Thank you for allowing me to participate in patient's care.  If I can answer any additional questions, I would be pleased to do so.    Sincerely,    Rylee Nuzum K. Allena Katz, DO

## 2018-02-23 NOTE — Patient Instructions (Addendum)
NCS/EMG of the right arm and leg.   ELECTROMYOGRAM AND NERVE CONDUCTION STUDIES (EMG/NCS) INSTRUCTIONS  How to Prepare The neurologist conducting the EMG will need to know if you have certain medical conditions. Tell the neurologist and other EMG lab personnel if you: . Have a pacemaker or any other electrical medical device . Take blood-thinning medications . Have hemophilia, a blood-clotting disorder that causes prolonged bleeding Bathing Take a shower or bath shortly before your exam in order to remove oils from your skin. Don't apply lotions or creams before the exam.  What to Expect You'll likely be asked to change into a hospital gown for the procedure and lie down on an examination table. The following explanations can help you understand what will happen during the exam.  . Electrodes. The neurologist or a technician places surface electrodes at various locations on your skin depending on where you're experiencing symptoms. Or the neurologist may insert needle electrodes at different sites depending on your symptoms.  . Sensations. The electrodes will at times transmit a tiny electrical current that you may feel as a twinge or spasm. The needle electrode may cause discomfort or pain that usually ends shortly after the needle is removed. If you are concerned about discomfort or pain, you may want to talk to the neurologist about taking a short break during the exam.  . Instructions. During the needle EMG, the neurologist will assess whether there is any spontaneous electrical activity when the muscle is at rest - activity that isn't present in healthy muscle tissue - and the degree of activity when you slightly contract the muscle.  He or she will give you instructions on resting and contracting a muscle at appropriate times. Depending on what muscles and nerves the neurologist is examining, he or she may ask you to change positions during the exam.  After your EMG You may experience some  temporary, minor bruising where the needle electrode was inserted into your muscle. This bruising should fade within several days. If it persists, contact your primary care doctor.    

## 2018-03-27 ENCOUNTER — Encounter: Payer: BLUE CROSS/BLUE SHIELD | Admitting: Neurology

## 2018-03-30 ENCOUNTER — Ambulatory Visit: Payer: BLUE CROSS/BLUE SHIELD

## 2019-03-26 DIAGNOSIS — Z03818 Encounter for observation for suspected exposure to other biological agents ruled out: Secondary | ICD-10-CM | POA: Diagnosis not present

## 2019-03-26 DIAGNOSIS — R509 Fever, unspecified: Secondary | ICD-10-CM | POA: Diagnosis not present

## 2019-03-26 DIAGNOSIS — Z20828 Contact with and (suspected) exposure to other viral communicable diseases: Secondary | ICD-10-CM | POA: Diagnosis not present

## 2019-04-03 DIAGNOSIS — Z20828 Contact with and (suspected) exposure to other viral communicable diseases: Secondary | ICD-10-CM | POA: Diagnosis not present

## 2019-04-08 DIAGNOSIS — E785 Hyperlipidemia, unspecified: Secondary | ICD-10-CM | POA: Diagnosis not present

## 2019-04-08 DIAGNOSIS — R7309 Other abnormal glucose: Secondary | ICD-10-CM | POA: Diagnosis not present

## 2019-04-08 DIAGNOSIS — M797 Fibromyalgia: Secondary | ICD-10-CM | POA: Diagnosis not present

## 2019-04-08 DIAGNOSIS — I10 Essential (primary) hypertension: Secondary | ICD-10-CM | POA: Diagnosis not present

## 2019-04-13 DIAGNOSIS — Z20828 Contact with and (suspected) exposure to other viral communicable diseases: Secondary | ICD-10-CM | POA: Diagnosis not present

## 2019-05-14 DIAGNOSIS — Z113 Encounter for screening for infections with a predominantly sexual mode of transmission: Secondary | ICD-10-CM | POA: Diagnosis not present

## 2019-05-14 DIAGNOSIS — Z01419 Encounter for gynecological examination (general) (routine) without abnormal findings: Secondary | ICD-10-CM | POA: Diagnosis not present

## 2019-05-22 ENCOUNTER — Other Ambulatory Visit: Payer: Self-pay

## 2019-05-22 ENCOUNTER — Encounter (HOSPITAL_COMMUNITY): Payer: Self-pay | Admitting: *Deleted

## 2019-05-22 ENCOUNTER — Ambulatory Visit (HOSPITAL_COMMUNITY)
Admission: EM | Admit: 2019-05-22 | Discharge: 2019-05-22 | Disposition: A | Discharge status: Home / Self Care | Payer: BC Managed Care – PPO | Attending: Family Medicine | Admitting: Family Medicine

## 2019-05-22 DIAGNOSIS — G43011 Migraine without aura, intractable, with status migrainosus: Secondary | ICD-10-CM | POA: Diagnosis not present

## 2019-05-22 MED ORDER — KETOROLAC TROMETHAMINE 60 MG/2ML IM SOLN
INTRAMUSCULAR | Status: AC
  Filled 2019-05-22: qty 2

## 2019-05-22 MED ORDER — KETOROLAC TROMETHAMINE 30 MG/ML IJ SOLN
INTRAMUSCULAR | Status: AC
  Filled 2019-05-22: qty 1

## 2019-05-22 MED ORDER — KETOROLAC TROMETHAMINE 60 MG/2ML IM SOLN
60.0000 mg | Freq: Once | INTRAMUSCULAR | Status: AC
  Administered 2019-05-22: 60 mg via INTRAMUSCULAR

## 2019-05-22 NOTE — ED Triage Notes (Signed)
Patient reports headache since Saturday, hx of migraines, states that migraine has been worsening all week. Patient normally takes Excedrin migraine. Patient denies photosensitivity. Reports dizziness and feeling of pressure in head.

## 2019-05-22 NOTE — Discharge Instructions (Signed)
You have a migraine headache. We have given you a Toradol shot for pain today.   Follow up with primary care as needed.   Follow up with the ER for unrelenting pain, high fever, or other concerning symptoms.

## 2019-05-22 NOTE — ED Provider Notes (Signed)
[facility]    CSN: [id number] Arrival date & time: 05/22/19  1054      History   Chief Complaint Chief Complaint  Patient presents with  . Migraine    HPI Julie Schwartz is a 44 y.o. female.   Reports that she has had a headache since Saturday, 05/18/2019.  Reports that she has taken Excedrin Migraine with no relief.  Reports history of migraines, does not have any prescription medications for this at home.  Denies photophobia.  Reports that headache is not getting worse, that it is just not going away.  Denies fever, abdominal pain, nausea, vomiting, diarrhea, body aches, chills, rash, other symptoms.  ROS per HPI  The history is provided by the patient.  Migraine    Past Medical History:  Diagnosis Date  . Anemia   . Depression   . Fibromyalgia   . Headache(784.0) 11/28/2012  . Iron deficiency anemia   . Migraine   . Myalgia   . Myalgia and myositis, unspecified 11/28/2012  . Obesity   . Panic attack     Patient Active Problem List   Diagnosis Date Noted  . Myalgia and myositis, unspecified 11/28/2012  . Headache(784.0) 11/28/2012  . Disturbance of skin sensation 11/28/2012    Past Surgical History:  Procedure Laterality Date  . CARPAL TUNNEL RELEASE Right   . CESAREAN SECTION    . TUBAL LIGATION      OB History    Gravida  2   Para  2   Term  2   Preterm      AB      Living  2     SAB      TAB      Ectopic      Multiple      Live Births               Home Medications    Prior to Admission medications   Medication Sig Start Date End Date Taking? Authorizing Provider  amitriptyline (ELAVIL) 25 MG tablet TK 2 TS PO HS 12/04/17   [provider]  cyclobenzaprine (FLEXERIL) 5 MG tablet Take 1 tablet (5 mg total) by mouth 3 (three) times daily as needed for muscle spasms. 08/06/15   Earley Favor, NP  gabapentin (NEURONTIN) 300 MG capsule TK 1 C PO UP TO QID 12/04/17   [provider]  ibuprofen  (ADVIL,MOTRIN) 800 MG tablet Take 1 tablet (800 mg total) by mouth 3 (three) times daily. 01/18/16   Mesner, Barbara Cower, MD    Family History Family History  Problem Relation Age of Onset  . Depression Father   . Breast cancer Maternal Grandmother   . Arthritis/Rheumatoid Maternal Grandmother   . Lupus Paternal Grandmother     Social History Social History   Tobacco Use  . Smoking status: Never Smoker  . Smokeless tobacco: Never Used  Substance Use Topics  . Alcohol use: Yes    Comment: occasionally  . Drug use: No     Allergies   Patient has no known allergies.   Review of Systems Review of Systems   Physical Exam Triage Vital Signs ED Triage Vitals  Enc Vitals Group     BP 05/22/19 1132 (!) 146/87     Pulse Rate 05/22/19 1132 68     Resp 05/22/19 1132 17     Temp 05/22/19 1132 98.1 F (36.7 C)     Temp Source 05/22/19 1132 Oral     SpO2  05/22/19 1132 100 %     Weight --      Height --      Head Circumference --      Peak Flow --      Pain Score 05/22/19 1130 10     Pain Loc --      Pain Edu? --      Excl. in [place]? --    No data found.  Updated Vital Signs BP (!) 146/87 (BP Location: Left Arm)   Pulse 68   Temp 98.1 F (36.7 C) (Oral)   Resp 17   LMP 05/05/2019   SpO2 100%      Physical Exam Vitals and nursing note reviewed.  Constitutional:      General: She is not in acute distress.    Appearance: She is well-developed.  HENT:     Head: Normocephalic and atraumatic.  Eyes:     Conjunctiva/sclera: Conjunctivae normal.  Neck:     Thyroid: No thyromegaly.     Trachea: Trachea normal.     Meningeal: Brudzinski's sign and Kernig's sign absent.      Comments: Areas of tenderness to palpation Cardiovascular:     Rate and Rhythm: Normal rate and regular rhythm.     Heart sounds: Normal heart sounds. No murmur.  Pulmonary:     Effort: Pulmonary effort is normal. No respiratory distress.     Breath sounds: Normal breath sounds. No stridor. No  wheezing, rhonchi or rales.  Abdominal:     General: Bowel sounds are normal. There is no distension.     Palpations: Abdomen is soft.     Tenderness: There is no abdominal tenderness.  Musculoskeletal:        General: Tenderness present.     Cervical back: Normal range of motion and neck supple. Muscular tenderness present.  Lymphadenopathy:     Cervical: No cervical adenopathy.  Skin:    General: Skin is warm and dry.     Capillary Refill: Capillary refill takes less than 2 seconds.  Neurological:     General: No focal deficit present.     Mental Status: She is alert and oriented to person, place, and time.  Psychiatric:        Mood and Affect: Mood normal.        Behavior: Behavior normal.      UC Treatments / Results  Labs (all labs ordered are listed, but only abnormal results are displayed) Labs Reviewed - No data to display  EKG   Radiology No results found.  Procedures Procedures (including critical care time)  Medications Ordered in UC Medications  ketorolac (TORADOL) injection 60 mg (has no administration in time range)    Initial Impression / Assessment and Plan / UC Course  I have reviewed the triage vital signs and the nursing notes.  Pertinent labs & imaging results that were available during my care of the patient were reviewed by me and considered in my medical decision making (see chart for details).     Patient presents with migraine x5 days.  Reports no relief with Excedrin Migraine.  60 mg Toradol IM in office today.  Instructed to follow-up with primary care as needed to explore possible migraine prevention.  Instructed on when to go to the ER. Final Clinical Impressions(s) / UC Diagnoses   Final diagnoses:  Intractable migraine without aura and with status migrainosus     Discharge Instructions     You have a migraine headache. We have given  you a Toradol shot for pain today.   Follow up with primary care as needed.   Follow up with  the ER for unrelenting pain, high fever, or other concerning symptoms.     ED Prescriptions    None     I have reviewed the PDMP during this encounter.   Moshe Cipro, NP 05/23/19 1415

## 2019-07-19 DIAGNOSIS — M797 Fibromyalgia: Secondary | ICD-10-CM | POA: Diagnosis not present

## 2019-07-19 DIAGNOSIS — F341 Dysthymic disorder: Secondary | ICD-10-CM | POA: Diagnosis not present

## 2019-07-19 DIAGNOSIS — Z1389 Encounter for screening for other disorder: Secondary | ICD-10-CM | POA: Diagnosis not present

## 2019-07-19 DIAGNOSIS — I10 Essential (primary) hypertension: Secondary | ICD-10-CM | POA: Diagnosis not present

## 2019-10-11 DIAGNOSIS — Z20822 Contact with and (suspected) exposure to covid-19: Secondary | ICD-10-CM | POA: Diagnosis not present

## 2019-10-12 DIAGNOSIS — J029 Acute pharyngitis, unspecified: Secondary | ICD-10-CM | POA: Diagnosis not present

## 2019-10-12 DIAGNOSIS — J040 Acute laryngitis: Secondary | ICD-10-CM | POA: Diagnosis not present

## 2019-10-12 DIAGNOSIS — Z1152 Encounter for screening for COVID-19: Secondary | ICD-10-CM | POA: Diagnosis not present

## 2019-10-23 DIAGNOSIS — J040 Acute laryngitis: Secondary | ICD-10-CM | POA: Diagnosis not present

## 2019-10-23 DIAGNOSIS — M797 Fibromyalgia: Secondary | ICD-10-CM | POA: Diagnosis not present

## 2019-10-23 DIAGNOSIS — I10 Essential (primary) hypertension: Secondary | ICD-10-CM | POA: Diagnosis not present

## 2019-11-18 DIAGNOSIS — Z412 Encounter for routine and ritual male circumcision: Secondary | ICD-10-CM | POA: Diagnosis not present

## 2020-02-20 ENCOUNTER — Other Ambulatory Visit: Payer: Self-pay

## 2020-02-20 ENCOUNTER — Ambulatory Visit (HOSPITAL_COMMUNITY)
Admission: EM | Admit: 2020-02-20 | Discharge: 2020-02-20 | Disposition: A | Discharge status: Home / Self Care | Payer: BC Managed Care – PPO | Attending: Family Medicine | Admitting: Family Medicine

## 2020-02-20 ENCOUNTER — Encounter (HOSPITAL_COMMUNITY): Payer: Self-pay

## 2020-02-20 DIAGNOSIS — R21 Rash and other nonspecific skin eruption: Secondary | ICD-10-CM | POA: Diagnosis not present

## 2020-02-20 DIAGNOSIS — L723 Sebaceous cyst: Secondary | ICD-10-CM

## 2020-02-20 MED ORDER — CLOBETASOL PROPIONATE 0.05 % EX OINT: 1.0000 "application " | TOPICAL_OINTMENT | Freq: Two times a day (BID) | CUTANEOUS | 0 refills | Status: DC | PRN

## 2020-02-20 NOTE — ED Provider Notes (Signed)
[facility]    CSN: [id number] Arrival date & time: 02/20/20  0830      History   Chief Complaint Chief Complaint  Patient presents with  . Rash    HPI Julie Schwartz is a 44 y.o. female.   Presenting today with a fine rash that comes and goes on her back and on her abdomen b/l. It itches and hurts like pins and needles at times. Also having a large bump on back that seems to be getting larger the last week as well. Has tried a benadryl at onset of these sxs but otherwise not trying anything at home. No hx of similar incidents. No new foods, home products, supplements or medications recently. Denies fever, chills, throat itching, wheezing.      Past Medical History:  Diagnosis Date  . Anemia   . Depression   . Fibromyalgia   . Headache(784.0) 11/28/2012  . Iron deficiency anemia   . Migraine   . Myalgia   . Myalgia and myositis, unspecified 11/28/2012  . Obesity   . Panic attack     Patient Active Problem List   Diagnosis Date Noted  . Myalgia and myositis, unspecified 11/28/2012  . Headache(784.0) 11/28/2012  . Disturbance of skin sensation 11/28/2012    Past Surgical History:  Procedure Laterality Date  . CARPAL TUNNEL RELEASE Right   . CESAREAN SECTION    . TUBAL LIGATION      OB History    Gravida  2   Para  2   Term  2   Preterm      AB      Living  2     SAB      TAB      Ectopic      Multiple      Live Births               Home Medications    Prior to Admission medications   Medication Sig Start Date End Date Taking? Authorizing Provider  amitriptyline (ELAVIL) 25 MG tablet TK 2 TS PO HS 12/04/17  Yes [provider]  gabapentin (NEURONTIN) 300 MG capsule TK 1 C PO UP TO QID 12/04/17  Yes [provider]  clobetasol ointment (TEMOVATE) 0.05 % Apply 1 application topically 2 (two) times daily as needed. Use for up to 1 week at a time as needed, avoid face and private areas with this medication  02/20/20   Particia Nearing, PA-C  cyclobenzaprine (FLEXERIL) 5 MG tablet Take 1 tablet (5 mg total) by mouth 3 (three) times daily as needed for muscle spasms. 08/06/15   Earley Favor, NP  ibuprofen (ADVIL,MOTRIN) 800 MG tablet Take 1 tablet (800 mg total) by mouth 3 (three) times daily. 01/18/16   Mesner, Barbara Cower, MD    Family History Family History  Problem Relation Age of Onset  . Depression Father   . Breast cancer Maternal Grandmother   . Arthritis/Rheumatoid Maternal Grandmother   . Lupus Paternal Grandmother     Social History Social History   Tobacco Use  . Smoking status: Never Smoker  . Smokeless tobacco: Never Used  Vaping Use  . Vaping Use: Never used  Substance Use Topics  . Alcohol use: Yes    Comment: occasionally  . Drug use: No     Allergies   Patient has no known allergies.   Review of Systems Review of Systems PER HPI   Physical Exam Triage Vital Signs ED Triage Vitals  Enc Vitals Group     BP 02/20/20 0854 133/78     Pulse Rate 02/20/20 0854 78     Resp 02/20/20 0854 19     Temp 02/20/20 0854 98.2 F (36.8 C)     Temp src --      SpO2 02/20/20 0854 100 %     Weight --      Height --      Head Circumference --      Peak Flow --      Pain Score 02/20/20 0853 8     Pain Loc --      Pain Edu? --      Excl. in GC? --    No data found.  Updated Vital Signs BP 133/78   Pulse 78   Temp 98.2 F (36.8 C)   Resp 19   LMP 02/05/2020   SpO2 100%   Visual Acuity Right Eye Distance:   Left Eye Distance:   Bilateral Distance:    Right Eye Near:   Left Eye Near:    Bilateral Near:     Physical Exam Vitals and nursing note reviewed.  Constitutional:      Appearance: Normal appearance. She is not ill-appearing.  HENT:     Head: Atraumatic.  Eyes:     Extraocular Movements: Extraocular movements intact.     Conjunctiva/sclera: Conjunctivae normal.  Cardiovascular:     Rate and Rhythm: Normal rate and regular rhythm.     Heart  sounds: Normal heart sounds.  Pulmonary:     Effort: Pulmonary effort is normal.     Breath sounds: Normal breath sounds.  Musculoskeletal:        General: Normal range of motion.     Cervical back: Normal range of motion and neck supple.  Skin:    General: Skin is warm.     Comments: Large blackhead present left upper back with sebaceous buildup forming cyst. Non erythematous, no fluctuance Fine maculopapular rash across b/l upper abdomen and under breasts, no current rash across upper back  Neurological:     Mental Status: She is alert and oriented to person, place, and time.  Psychiatric:        Mood and Affect: Mood normal.        Thought Content: Thought content normal.        Judgment: Judgment normal.      UC Treatments / Results  Labs (all labs ordered are listed, but only abnormal results are displayed) Labs Reviewed - No data to display  EKG   Radiology No results found.  Procedures Procedures (including critical care time)  Medications Ordered in UC Medications - No data to display  Initial Impression / Assessment and Plan / UC Course  I have reviewed the triage vital signs and the nursing notes.  Pertinent labs & imaging results that were available during my care of the patient were reviewed by me and considered in my medical decision making (see chart for details).     Rash not consistent with yeast dermatitis or shingles, but also not straightfoward allergic or viral. Will trial clobetasol ointment, antihistamines, and monitor for resolution. Attempted to express blackhead and sebaceous buildup today without relief. Will avoid I and D as the area is not currently infected or fluctuant. Discussed etiology and some options for next steps with patient.   Final Clinical Impressions(s) / UC Diagnoses   Final diagnoses:  Rash  Sebaceous cyst   Discharge Instructions   None  ED Prescriptions    Medication Sig Dispense Auth. Provider   clobetasol  ointment (TEMOVATE) 0.05 % Apply 1 application topically 2 (two) times daily as needed. Use for up to 1 week at a time as needed, avoid face and private areas with this medication 30 g Particia Nearing, [place]     PDMP not reviewed this encounter.   Particia Nearing, [place] 02/20/20 [phone]

## 2020-02-20 NOTE — ED Triage Notes (Signed)
Pt presents with complaints of itchy, painful rash to bilateral upper abdomen and back. Reports the rash has been off and on and has now a lump that is tender to touch on her back. Reports symptoms started on Saturday. No relief with medications at home.

## 2020-03-03 DIAGNOSIS — Z20822 Contact with and (suspected) exposure to covid-19: Secondary | ICD-10-CM | POA: Diagnosis not present

## 2020-04-08 DIAGNOSIS — S46912A Strain of unspecified muscle, fascia and tendon at shoulder and upper arm level, left arm, initial encounter: Secondary | ICD-10-CM | POA: Diagnosis not present

## 2020-04-08 DIAGNOSIS — R079 Chest pain, unspecified: Secondary | ICD-10-CM | POA: Diagnosis not present

## 2020-04-10 DIAGNOSIS — R0789 Other chest pain: Secondary | ICD-10-CM | POA: Diagnosis not present

## 2020-04-14 DIAGNOSIS — S20219A Contusion of unspecified front wall of thorax, initial encounter: Secondary | ICD-10-CM | POA: Diagnosis not present

## 2020-04-29 ENCOUNTER — Other Ambulatory Visit: Payer: Self-pay | Admitting: Internal Medicine

## 2020-04-29 DIAGNOSIS — Z1231 Encounter for screening mammogram for malignant neoplasm of breast: Secondary | ICD-10-CM

## 2020-04-29 DIAGNOSIS — Z0000 Encounter for general adult medical examination without abnormal findings: Secondary | ICD-10-CM | POA: Diagnosis not present

## 2020-04-29 DIAGNOSIS — E785 Hyperlipidemia, unspecified: Secondary | ICD-10-CM | POA: Diagnosis not present

## 2020-04-29 DIAGNOSIS — Z131 Encounter for screening for diabetes mellitus: Secondary | ICD-10-CM | POA: Diagnosis not present

## 2020-04-29 DIAGNOSIS — Z23 Encounter for immunization: Secondary | ICD-10-CM | POA: Diagnosis not present

## 2020-06-19 ENCOUNTER — Ambulatory Visit: Payer: BC Managed Care – PPO

## 2020-06-27 ENCOUNTER — Inpatient Hospital Stay: Admission: RE | Admit: 2020-06-27 | Payer: BC Managed Care – PPO | Source: Ambulatory Visit

## 2020-08-04 ENCOUNTER — Encounter (HOSPITAL_COMMUNITY): Payer: Self-pay

## 2020-08-04 ENCOUNTER — Emergency Department (HOSPITAL_COMMUNITY)
Admission: EM | Admit: 2020-08-04 | Discharge: 2020-08-04 | Disposition: A | Discharge status: Home / Self Care | Payer: BC Managed Care – PPO | Attending: Emergency Medicine | Admitting: Emergency Medicine

## 2020-08-04 ENCOUNTER — Emergency Department (HOSPITAL_COMMUNITY): Payer: BC Managed Care – PPO

## 2020-08-04 ENCOUNTER — Other Ambulatory Visit: Payer: Self-pay

## 2020-08-04 DIAGNOSIS — R519 Headache, unspecified: Secondary | ICD-10-CM | POA: Diagnosis not present

## 2020-08-04 DIAGNOSIS — Z8669 Personal history of other diseases of the nervous system and sense organs: Secondary | ICD-10-CM | POA: Diagnosis not present

## 2020-08-04 DIAGNOSIS — R42 Dizziness and giddiness: Secondary | ICD-10-CM | POA: Diagnosis not present

## 2020-08-04 LAB — BASIC METABOLIC PANEL
Anion gap: 5 (ref 5–15)
BUN: 14 mg/dL (ref 6–20)
CO2: 27 mmol/L (ref 22–32)
Calcium: 9 mg/dL (ref 8.9–10.3)
Chloride: 106 mmol/L (ref 98–111)
Creatinine, Ser: 1.04 mg/dL — ABNORMAL HIGH (ref 0.44–1.00)
GFR, Estimated: >60 mL/min (ref >60)
Glucose, Bld: 98 mg/dL (ref 70–99)
Potassium: 4.6 mmol/L (ref 3.5–5.1)
Sodium: 138 mmol/L (ref 135–145)

## 2020-08-04 LAB — CBC WITH DIFFERENTIAL/PLATELET
Abs Immature Granulocytes: 0 10*3/uL (ref 0.00–0.07)
Basophils Absolute: 0 10*3/uL (ref 0.0–0.1)
Basophils Relative: 1 %
Eosinophils Absolute: 0.1 10*3/uL (ref 0.0–0.5)
Eosinophils Relative: 2 %
HCT: 37.4 % (ref 36.0–46.0)
Hemoglobin: 11.4 g/dL — ABNORMAL LOW (ref 12.0–15.0)
Immature Granulocytes: 0 %
Lymphocytes Relative: 56 %
Lymphs Abs: 2.5 10*3/uL (ref 0.7–4.0)
MCH: 30.2 pg (ref 26.0–34.0)
MCHC: 30.5 g/dL (ref 30.0–36.0)
MCV: 99.2 fL (ref 80.0–100.0)
Monocytes Absolute: 0.3 10*3/uL (ref 0.1–1.0)
Monocytes Relative: 7 %
Neutro Abs: 1.5 10*3/uL — ABNORMAL LOW (ref 1.7–7.7)
Neutrophils Relative %: 34 %
Platelets: 295 10*3/uL (ref 150–400)
RBC: 3.77 MIL/uL — ABNORMAL LOW (ref 3.87–5.11)
RDW: 12.5 % (ref 11.5–15.5)
WBC: 4.3 10*3/uL (ref 4.0–10.5)
nRBC: 0 % (ref 0.0–0.2)

## 2020-08-04 LAB — I-STAT BETA HCG BLOOD, ED (MC, WL, AP ONLY): I-stat hCG, quantitative: <5 m[IU]/mL (ref <5)

## 2020-08-04 MED ORDER — PROCHLORPERAZINE EDISYLATE 10 MG/2ML IJ SOLN
10.0000 mg | Freq: Once | INTRAMUSCULAR | Status: AC
  Administered 2020-08-04: 10 mg via INTRAVENOUS
  Filled 2020-08-04: qty 2

## 2020-08-04 MED ORDER — ACETAMINOPHEN 500 MG PO TABS
1000.0000 mg | ORAL_TABLET | Freq: Once | ORAL | Status: AC
  Administered 2020-08-04: 1000 mg via ORAL
  Filled 2020-08-04: qty 2

## 2020-08-04 MED ORDER — DIPHENHYDRAMINE HCL 50 MG/ML IJ SOLN
25.0000 mg | Freq: Once | INTRAMUSCULAR | Status: AC
  Administered 2020-08-04: 25 mg via INTRAVENOUS
  Filled 2020-08-04: qty 1

## 2020-08-04 NOTE — Discharge Instructions (Addendum)
Follow-up with your primary care doctor.  Take Tylenol or Motrin as needed for pain control.  Return to ER for uncontrolled headache, nausea, vomiting, fever or other new concerning symptom.

## 2020-08-04 NOTE — ED Triage Notes (Signed)
Patient reports that she has had a headache since last night and states she has frequent headaches, but this one feels different. Patient states, "I feel like my head is swelling." Patient does c/o weakness, sensitivity to light, and dizziness. Patient denies N/v

## 2020-08-04 NOTE — ED Provider Notes (Signed)
[facility]EMERGENCY [facility]PT Provider Note   CSN: [id number] Arrival date & time: 08/04/20  1113     History Chief Complaint  Patient presents with  . Headache  . Dizziness    Julie Schwartz is a 45 y.o. female.  Presenting to the emergency room with concern for headache.  Patient reports since last night she has been having severe headache, up to 10 out of 10 in severity, frontal, described as pressure.  No neck pain or neck stiffness.  Feels somewhat lightheaded, denies room spinning sensation.  No vomiting.  No speech changes vision changes, numbness or weakness.  Does have history of prior headaches.  Has not taken any medicine today.  Took some Tylenol yesterday without relief.  HPI     Past Medical History:  Diagnosis Date  . Anemia   . Depression   . Fibromyalgia   . Headache(784.0) 11/28/2012  . Iron deficiency anemia   . Migraine   . Myalgia   . Myalgia and myositis, unspecified 11/28/2012  . Obesity   . Panic attack     Patient Active Problem List   Diagnosis Date Noted  . Myalgia and myositis, unspecified 11/28/2012  . Headache(784.0) 11/28/2012  . Disturbance of skin sensation 11/28/2012    Past Surgical History:  Procedure Laterality Date  . CARPAL TUNNEL RELEASE Right   . CESAREAN SECTION    . TUBAL LIGATION       OB History    Gravida  2   Para  2   Term  2   Preterm      AB      Living  2     SAB      IAB      Ectopic      Multiple      Live Births              Family History  Problem Relation Age of Onset  . Depression Father   . Breast cancer Maternal Grandmother   . Arthritis/Rheumatoid Maternal Grandmother   . Lupus Paternal Grandmother     Social History   Tobacco Use  . Smoking status: Never Smoker  . Smokeless tobacco: Never Used  Vaping Use  . Vaping Use: Never used  Substance Use Topics  . Alcohol use: Yes    Comment: occasionally  . Drug use: No    Home Medications Prior to  Admission medications   Medication Sig Start Date End Date Taking? Authorizing Provider  amitriptyline (ELAVIL) 25 MG tablet TK 2 TS PO HS 12/04/17   [provider]  clobetasol ointment (TEMOVATE) 0.05 % Apply 1 application topically 2 (two) times daily as needed. Use for up to 1 week at a time as needed, avoid face and private areas with this medication 02/20/20   Particia Nearing, PA-C  cyclobenzaprine (FLEXERIL) 5 MG tablet Take 1 tablet (5 mg total) by mouth 3 (three) times daily as needed for muscle spasms. 08/06/15   Earley Favor, NP  gabapentin (NEURONTIN) 300 MG capsule TK 1 C PO UP TO QID 12/04/17   [provider]  ibuprofen (ADVIL,MOTRIN) 800 MG tablet Take 1 tablet (800 mg total) by mouth 3 (three) times daily. 01/18/16   Mesner, Barbara Cower, MD    Allergies    Patient has no known allergies.  Review of Systems   Review of Systems  Constitutional: Negative for chills and fever.  HENT: Negative for ear pain and sore throat.   Eyes:  Negative for pain and visual disturbance.  Respiratory: Negative for cough and shortness of breath.   Cardiovascular: Negative for chest pain and palpitations.  Gastrointestinal: Negative for abdominal pain and vomiting.  Genitourinary: Negative for dysuria and hematuria.  Musculoskeletal: Negative for arthralgias and back pain.  Skin: Negative for color change and rash.  Neurological: Positive for headaches. Negative for seizures and syncope.  All other systems reviewed and are negative.   Physical Exam Updated Vital Signs BP (!) 148/104 (BP Location: Right Arm)   Pulse 99   Temp 98 F (36.7 C) (Oral)   Resp 18   Ht 5\' 9"  (1.753 m)   Wt 104.3 kg   LMP 07/20/2020   SpO2 100%   BMI 33.97 kg/m   Physical Exam Vitals and nursing note reviewed.  Constitutional:      General: She is not in acute distress.    Appearance: She is well-developed.  HENT:     Head: Normocephalic and atraumatic.  Eyes:     Conjunctiva/sclera:  Conjunctivae normal.  Cardiovascular:     Rate and Rhythm: Normal rate and regular rhythm.     Heart sounds: No murmur heard.   Pulmonary:     Effort: Pulmonary effort is normal. No respiratory distress.     Breath sounds: Normal breath sounds.  Abdominal:     Palpations: Abdomen is soft.     Tenderness: There is no abdominal tenderness.  Musculoskeletal:     Cervical back: Neck supple.  Skin:    General: Skin is warm and dry.  Neurological:     Mental Status: She is alert.     Comments: AAOx3 CN 2-12 intact, speech clear visual fields intact 5/5 strength in b/l UE and LE Sensation to light touch intact in b/l UE and LE Normal FNF Normal gait  Psychiatric:        Mood and Affect: Mood normal.     ED Results / Procedures / Treatments   Labs (all labs ordered are listed, but only abnormal results are displayed) Labs Reviewed  CBC WITH DIFFERENTIAL/PLATELET - Abnormal; Notable for the following components:      Result Value   RBC 3.77 (*)    Hemoglobin 11.4 (*)    Neutro Abs 1.5 (*)    All other components within normal limits  BASIC METABOLIC PANEL - Abnormal; Notable for the following components:   Creatinine, Ser 1.04 (*)    All other components within normal limits  I-STAT BETA HCG BLOOD, ED ([facility], [facility], [facility] ONLY)    EKG None  Radiology CT Head Wo Contrast  Result Date: 08/04/2020 CLINICAL DATA:  Headache and photophobia EXAM: CT HEAD WITHOUT CONTRAST TECHNIQUE: Contiguous axial images were obtained from the base of the skull through the vertex without intravenous contrast. COMPARISON:  April 29, 2012 head CT FINDINGS: Brain: Ventricles and sulci are normal in size and configuration. Right lateral ventricle is slightly larger than left lateral ventricle, a stable presumed anatomic variant. There is no intracranial mass, hemorrhage, extra-axial fluid collection, or midline shift. Normal appearing brain parenchyma. No evident acute infarct. Vascular: No hyperdense  vessel.  No evident vascular calcification. Skull: Bony calvarium appears intact. Sinuses/Orbits: Visualized paranasal sinuses are clear. Visualized orbits appear symmetric bilaterally. Other: Mastoid air cells are clear. IMPRESSION: Study within normal limits. Electronically Signed   By: May 01, 2012 III M.D.   On: 08/04/2020 13:07    Procedures Procedures   Medications Ordered in ED Medications  prochlorperazine (COMPAZINE) injection 10 mg (  10 mg Intravenous Given 08/04/20 1211)  diphenhydrAMINE (BENADRYL) injection 25 mg (25 mg Intravenous Given 08/04/20 1211)  acetaminophen (TYLENOL) tablet 1,000 mg (1,000 mg Oral Given 08/04/20 1211)    ED Course  I have reviewed the triage vital signs and the nursing notes.  Pertinent labs & imaging results that were available during my care of the patient were reviewed by me and considered in my medical decision making (see chart for details).    MDM Rules/Calculators/A&P                         45 year old lady presented to ER with concern for headache.  On exam patient mildly uncomfortable but no distress.  Normal neurologic exam.  CT head negative for acute process.  Suspect tension type headache versus migraine.  Symptoms improved after migraine cocktail, will discharge home.   After the discussed management above, the patient was determined to be safe for discharge.  The patient was in agreement with this plan and all questions regarding their care were answered.  ED return precautions were discussed and the patient will return to the ED with any significant worsening of condition.   Final Clinical Impression(s) / ED Diagnoses Final diagnoses:  Bad headache    Rx / DC Orders ED Discharge Orders    None       Milagros Loll, MD 08/04/20 1352

## 2020-08-05 DIAGNOSIS — R519 Headache, unspecified: Secondary | ICD-10-CM | POA: Diagnosis not present

## 2020-08-05 DIAGNOSIS — I10 Essential (primary) hypertension: Secondary | ICD-10-CM | POA: Diagnosis not present

## 2020-08-14 ENCOUNTER — Ambulatory Visit (HOSPITAL_COMMUNITY)
Admission: EM | Admit: 2020-08-14 | Discharge: 2020-08-14 | Disposition: A | Discharge status: Home / Self Care | Payer: BC Managed Care – PPO | Attending: Emergency Medicine | Admitting: Emergency Medicine

## 2020-08-14 ENCOUNTER — Other Ambulatory Visit: Payer: Self-pay

## 2020-08-14 ENCOUNTER — Encounter (HOSPITAL_COMMUNITY): Payer: Self-pay

## 2020-08-14 DIAGNOSIS — U071 COVID-19: Secondary | ICD-10-CM | POA: Diagnosis not present

## 2020-08-14 DIAGNOSIS — B349 Viral infection, unspecified: Secondary | ICD-10-CM | POA: Diagnosis not present

## 2020-08-14 LAB — POCT RAPID STREP A, ED / UC: Streptococcus, Group A Screen (Direct): NEGATIVE

## 2020-08-14 LAB — SARS CORONAVIRUS 2 (TAT 6-24 HRS): SARS Coronavirus 2: POSITIVE — AB

## 2020-08-14 MED ORDER — PSEUDOEPH-BROMPHEN-DM 30-2-10 MG/5ML PO SYRP: 10.0000 mL | ORAL_SOLUTION | Freq: Four times a day (QID) | ORAL | 0 refills | Status: DC | PRN

## 2020-08-14 MED ORDER — LIDOCAINE VISCOUS HCL 2 % MT SOLN: 10.0000 mL | OROMUCOSAL | 0 refills | Status: DC | PRN

## 2020-08-14 MED ORDER — FLUTICASONE PROPIONATE 50 MCG/ACT NA SUSP: 1.0000 | Freq: Two times a day (BID) | NASAL | 0 refills | Status: DC

## 2020-08-14 NOTE — ED Provider Notes (Signed)
[facility] Provider Note  ____________________________________________  Time seen: Approximately 8:52 AM  I have reviewed the triage vital signs and the nursing notes.   HISTORY  Chief Complaint Sore Throat, Cough, and Fever    HPI Julie Schwartz is a 45 y.o. female who presents to the urgent care complaining of fevers and chills, nasal congestion, sore throat, cough, body aches.  Symptoms have been ongoing x3 days.  No difficulty breathing or swallowing.  No headache, visual changes, neck pain or stiffness, chest pain, shortness of breath, abdominal pain, nausea vomiting, diarrhea or constipation.  Patient has been taking over-the-counter medications for symptom relief with only mild effect.  Medical history as described below with no complaints of chronic medical issues.         Past Medical History:  Diagnosis Date  . Anemia   . Depression   . Fibromyalgia   . Headache(784.0) 11/28/2012  . Iron deficiency anemia   . Migraine   . Myalgia   . Myalgia and myositis, unspecified 11/28/2012  . Obesity   . Panic attack     Patient Active Problem List   Diagnosis Date Noted  . Myalgia and myositis, unspecified 11/28/2012  . Headache(784.0) 11/28/2012  . Disturbance of skin sensation 11/28/2012    Past Surgical History:  Procedure Laterality Date  . CARPAL TUNNEL RELEASE Right   . CESAREAN SECTION    . TUBAL LIGATION      Prior to Admission medications   Medication Sig Start Date End Date Taking? Authorizing Provider  brompheniramine-pseudoephedrine-DM 30-2-10 MG/5ML syrup Take 10 mLs by mouth 4 (four) times daily as needed. 08/14/20  Yes Leelyn Jasinski, Delorise Royals, PA-C  fluticasone (FLONASE) 50 MCG/ACT nasal spray Place 1 spray into both nostrils 2 (two) times daily. 08/14/20  Yes Silvanna Ohmer, Delorise Royals, PA-C  lidocaine (XYLOCAINE) 2 % solution Use as directed 10 mLs in the mouth or throat every 4 (four) hours as needed (sore throat). 08/14/20  Yes Liel Rudden,  Delorise Royals, PA-C  amitriptyline (ELAVIL) 25 MG tablet TK 2 TS PO HS 12/04/17   [provider]  clobetasol ointment (TEMOVATE) 0.05 % Apply 1 application topically 2 (two) times daily as needed. Use for up to 1 week at a time as needed, avoid face and private areas with this medication 02/20/20   Particia Nearing, PA-C  cyclobenzaprine (FLEXERIL) 5 MG tablet Take 1 tablet (5 mg total) by mouth 3 (three) times daily as needed for muscle spasms. 08/06/15   Earley Favor, NP  gabapentin (NEURONTIN) 300 MG capsule TK 1 C PO UP TO QID 12/04/17   [provider]  ibuprofen (ADVIL,MOTRIN) 800 MG tablet Take 1 tablet (800 mg total) by mouth 3 (three) times daily. 01/18/16   Mesner, Barbara Cower, MD    Allergies Patient has no known allergies.  Family History  Problem Relation Age of Onset  . Depression Father   . Breast cancer Maternal Grandmother   . Arthritis/Rheumatoid Maternal Grandmother   . Lupus Paternal Grandmother     Social History Social History   Tobacco Use  . Smoking status: Never Smoker  . Smokeless tobacco: Never Used  Vaping Use  . Vaping Use: Never used  Substance Use Topics  . Alcohol use: Yes    Comment: occasionally  . Drug use: No     Review of Systems  Constitutional: Positive fever/chills Eyes: No visual changes. No discharge ENT: Positive nasal congestion and sore throat Cardiovascular: no chest pain. Respiratory: Positive cough. No SOB.  Gastrointestinal: No abdominal pain.  No nausea, no vomiting.  No diarrhea.  No constipation. Musculoskeletal: Negative for musculoskeletal pain. Skin: Negative for rash, abrasions, lacerations, ecchymosis. Neurological: Negative for headaches, focal weakness or numbness.  10 System ROS otherwise negative.  ____________________________________________   PHYSICAL EXAM:  VITAL SIGNS: ED Triage Vitals  Enc Vitals Group     BP 08/14/20 0828 138/76     Pulse Rate 08/14/20 0828 96     Resp 08/14/20 0828  19     Temp 08/14/20 0828 100.3 F (37.9 C)     Temp Source 08/14/20 0828 Oral     SpO2 08/14/20 0828 100 %     Weight --      Height --      Head Circumference --      Peak Flow --      Pain Score 08/14/20 0827 8     Pain Loc --      Pain Edu? --      Excl. in GC? --      Constitutional: Alert and oriented. Well appearing and in no acute distress. Eyes: Conjunctivae are normal. PERRL. EOMI. Head: Atraumatic. ENT:      Ears: EACs are unremarkable bilaterally.  TMs are mildly bulging but no injection.      Nose: No congestion/rhinnorhea.      Mouth/Throat: Mucous membranes are moist.  Oropharynx is erythematous with tonsillar erythema bilaterally.  No edema or exudates.  Uvula is midline. Neck: No stridor.  Neck is supple full range of motion Hematological/Lymphatic/Immunilogical: No cervical lymphadenopathy. Cardiovascular: Normal rate, regular rhythm. Normal S1 and S2.  Good peripheral circulation. Respiratory: Normal respiratory effort without tachypnea or retractions. Lungs CTAB. Good air entry to the bases with no decreased or absent breath sounds. Musculoskeletal: Full range of motion to all extremities. No gross deformities appreciated. Neurologic:  Normal speech and language. No gross focal neurologic deficits are appreciated.  Skin:  Skin is warm, dry and intact. No rash noted. Psychiatric: Mood and affect are normal. Speech and behavior are normal. Patient exhibits appropriate insight and judgement.   ____________________________________________   LABS (all labs ordered are listed, but only abnormal results are displayed)  Labs Reviewed  SARS CORONAVIRUS 2 (TAT 6-24 HRS)  POCT RAPID STREP A, ED / UC   ____________________________________________  EKG   ____________________________________________  RADIOLOGY   No results found.  ____________________________________________    PROCEDURES  Procedure(s) performed:    Procedures    Medications -  No data to display   ____________________________________________   INITIAL IMPRESSION / ASSESSMENT AND PLAN / ED COURSE  Pertinent labs & imaging results that were available during my care of the patient were reviewed by me and considered in my medical decision making (see chart for details).  Review of the [place] CSRS was performed in accordance of the [facility] prior to dispensing any controlled drugs.           Patient's diagnosis is consistent with viral illness.  Patient presented to the urgent care with complaint of fever, chills, nasal congestion, sore throat, cough, body aches.  Symptoms x3 days.  No recent sick contacts according to the patient.  Exam was reassuring at this time.  Patient had a negative strep test and has COVID swab pending currently.  She will follow this result in MyChart.  Patient is written symptom control medications and needs to continue Tylenol and Motrin at home.  Plenty of fluids and rest.  Follow-up with primary care as needed.  Return precautions to the urgent care or precautions for the emergency department are discussed with the patient..     ____________________________________________  FINAL CLINICAL IMPRESSION(S) / ED DIAGNOSES  Final diagnoses:  Viral illness      NEW MEDICATIONS STARTED DURING THIS VISIT:  ED Discharge Orders         Ordered    fluticasone (FLONASE) 50 MCG/ACT nasal spray  2 times daily        08/14/20 0908    brompheniramine-pseudoephedrine-DM 30-2-10 MG/5ML syrup  4 times daily PRN        08/14/20 0908    lidocaine (XYLOCAINE) 2 % solution  Every 4 hours PRN        08/14/20 0908              This chart was dictated using voice recognition software/Dragon. Despite best efforts to proofread, errors can occur which can change the meaning. Any change was purely unintentional.    Racheal Patches, PA-C 08/14/20 [phone]

## 2020-08-14 NOTE — ED Triage Notes (Signed)
Pt reports sore throat, chills, fever 101.2 F, body aches x 3 days

## 2020-08-15 ENCOUNTER — Telehealth: Payer: Self-pay | Admitting: Unknown Physician Specialty

## 2020-08-15 NOTE — Telephone Encounter (Signed)
Called to discuss with patient about COVID-19 symptoms and the use of one of the available treatments for those with mild to moderate Covid symptoms and at a high risk of hospitalization.  Pt appears to qualify for outpatient treatment due to co-morbid conditions and/or a member of an at-risk group in accordance with the FDA Emergency Use Authorization.    Symptom onset: 5/25? Vaccinated: ? Booster?  Qualifiers: Ethnicity, obesity NIH Criteria: 2  Unable to reach pt - LMOM   Gabriel Cirri

## 2020-08-16 LAB — CULTURE, GROUP A STREP (THRC)

## 2020-09-01 DIAGNOSIS — R509 Fever, unspecified: Secondary | ICD-10-CM | POA: Diagnosis not present

## 2020-09-01 DIAGNOSIS — U071 COVID-19: Secondary | ICD-10-CM | POA: Diagnosis not present

## 2020-09-01 DIAGNOSIS — R059 Cough, unspecified: Secondary | ICD-10-CM | POA: Diagnosis not present

## 2020-09-11 ENCOUNTER — Ambulatory Visit
Admission: RE | Admit: 2020-09-11 | Discharge: 2020-09-11 | Disposition: A | Discharge status: Home / Self Care | Payer: BC Managed Care – PPO | Source: Ambulatory Visit | Attending: Internal Medicine | Admitting: Internal Medicine

## 2020-09-11 ENCOUNTER — Other Ambulatory Visit: Payer: Self-pay

## 2020-09-11 DIAGNOSIS — Z1231 Encounter for screening mammogram for malignant neoplasm of breast: Secondary | ICD-10-CM | POA: Diagnosis not present

## 2021-03-19 ENCOUNTER — Other Ambulatory Visit: Payer: Self-pay

## 2021-03-19 ENCOUNTER — Emergency Department (HOSPITAL_COMMUNITY)
Admission: EM | Admit: 2021-03-19 | Discharge: 2021-03-20 | Disposition: A | Discharge status: Home / Self Care | Payer: 59 | Attending: Emergency Medicine | Admitting: Emergency Medicine

## 2021-03-19 ENCOUNTER — Encounter (HOSPITAL_COMMUNITY): Payer: Self-pay

## 2021-03-19 ENCOUNTER — Emergency Department (HOSPITAL_COMMUNITY): Payer: 59

## 2021-03-19 DIAGNOSIS — R1032 Left lower quadrant pain: Secondary | ICD-10-CM | POA: Diagnosis present

## 2021-03-19 DIAGNOSIS — Z5321 Procedure and treatment not carried out due to patient leaving prior to being seen by health care provider: Secondary | ICD-10-CM | POA: Insufficient documentation

## 2021-03-19 LAB — CBC WITH DIFFERENTIAL/PLATELET
Abs Immature Granulocytes: 0.01 10*3/uL (ref 0.00–0.07)
Basophils Absolute: 0 10*3/uL (ref 0.0–0.1)
Basophils Relative: 1 %
Eosinophils Absolute: 0.1 10*3/uL (ref 0.0–0.5)
Eosinophils Relative: 1 %
HCT: 37.4 % (ref 36.0–46.0)
Hemoglobin: 11.7 g/dL — ABNORMAL LOW (ref 12.0–15.0)
Immature Granulocytes: 0 %
Lymphocytes Relative: 61 %
Lymphs Abs: 2.8 10*3/uL (ref 0.7–4.0)
MCH: 30.8 pg (ref 26.0–34.0)
MCHC: 31.3 g/dL (ref 30.0–36.0)
MCV: 98.4 fL (ref 80.0–100.0)
Monocytes Absolute: 0.3 10*3/uL (ref 0.1–1.0)
Monocytes Relative: 7 %
Neutro Abs: 1.4 10*3/uL — ABNORMAL LOW (ref 1.7–7.7)
Neutrophils Relative %: 30 %
Platelets: 283 10*3/uL (ref 150–400)
RBC: 3.8 MIL/uL — ABNORMAL LOW (ref 3.87–5.11)
RDW: 12.5 % (ref 11.5–15.5)
WBC: 4.6 10*3/uL (ref 4.0–10.5)
nRBC: 0 % (ref 0.0–0.2)

## 2021-03-19 LAB — COMPREHENSIVE METABOLIC PANEL
ALT: 17 U/L (ref 0–44)
AST: 24 U/L (ref 15–41)
Albumin: 4 g/dL (ref 3.5–5.0)
Alkaline Phosphatase: 39 U/L (ref 38–126)
Anion gap: 6 (ref 5–15)
BUN: 14 mg/dL (ref 6–20)
CO2: 20 mmol/L — ABNORMAL LOW (ref 22–32)
Calcium: 8.6 mg/dL — ABNORMAL LOW (ref 8.9–10.3)
Chloride: 108 mmol/L (ref 98–111)
Creatinine, Ser: 1.05 mg/dL — ABNORMAL HIGH (ref 0.44–1.00)
GFR, Estimated: >60 mL/min (ref >60)
Glucose, Bld: 97 mg/dL (ref 70–99)
Potassium: 4.5 mmol/L (ref 3.5–5.1)
Sodium: 134 mmol/L — ABNORMAL LOW (ref 135–145)
Total Bilirubin: 0.8 mg/dL (ref 0.3–1.2)
Total Protein: 7.7 g/dL (ref 6.5–8.1)

## 2021-03-19 LAB — LIPASE, BLOOD: Lipase: 37 U/L (ref 11–51)

## 2021-03-19 NOTE — ED Triage Notes (Signed)
Patient went to [facility] UC today with c/o LLQ pain that radiates down the left leg x 3 days. Patient denies any N/v/D.  Patient was diagnosed with diverticulitis and was prescribed Cipro.

## 2021-03-19 NOTE — ED Provider Notes (Signed)
Emergency Medicine Provider Triage Evaluation Note  Julie Schwartz , a 45 y.o. female  was evaluated in triage.  Pt complains of LLQ pain that started 3 days ago and worsening since onset. She states pain started off as cramping and is now sharp and radiating down her left thigh. She went to UC this morning and had an xray of her spine done. They were suspicious that patient may have diverticulitis and referred her to the ED. She has not  had a bowel movement in 3 days but she says this can be normal for her.   Review of Systems  Positive: LLQ pain, constipation Negative: Nausea, vomiting,    Physical Exam  BP (!) 153/89    Pulse (!) 59    Temp 98.4 F (36.9 C) (Oral)    Resp 17    Ht 5\' 9"  (1.753 m)    Wt 95.3 kg    LMP 03/12/2021 (Approximate)    SpO2 100%    BMI 31.01 kg/m  Gen:   Awake, no distress   Resp:  Normal effort  MSK:   Moves extremities without difficulty  Other:  Significant LLQ tenderness and moderate LUQ tenderness. LLQ distention  Medical Decision Making  Medically screening exam initiated at 10:58 AM.  Appropriate orders placed.  LISSETT FAVORITE was informed that the remainder of the evaluation will be completed by another provider, this initial triage assessment does not replace that evaluation, and the importance of remaining in the ED until their evaluation is complete.     Ovidio Kin, Janell Quiet 03/19/21 1101    03/21/21, MD 03/19/21 [phone]

## 2021-07-23 ENCOUNTER — Ambulatory Visit (HOSPITAL_COMMUNITY)
Admission: EM | Admit: 2021-07-23 | Discharge: 2021-07-23 | Disposition: A | Discharge status: Home / Self Care | Payer: No Typology Code available for payment source | Attending: Nurse Practitioner | Admitting: Nurse Practitioner

## 2021-07-23 ENCOUNTER — Encounter (HOSPITAL_COMMUNITY): Payer: Self-pay | Admitting: Emergency Medicine

## 2021-07-23 DIAGNOSIS — L02212 Cutaneous abscess of back [any part, except buttock]: Secondary | ICD-10-CM | POA: Diagnosis not present

## 2021-07-23 MED ORDER — ACETAMINOPHEN 325 MG PO TABS
ORAL_TABLET | ORAL | Status: AC
  Filled 2021-07-23: qty 3

## 2021-07-23 MED ORDER — DOXYCYCLINE HYCLATE 100 MG PO CAPS: 100.0000 mg | ORAL_CAPSULE | Freq: Two times a day (BID) | ORAL | 0 refills | Status: DC

## 2021-07-23 MED ORDER — ACETAMINOPHEN 325 MG PO TABS
975.0000 mg | ORAL_TABLET | Freq: Once | ORAL | Status: AC
  Administered 2021-07-23: 975 mg via ORAL

## 2021-07-23 MED ORDER — LIDOCAINE-EPINEPHRINE 1 %-1:100000 IJ SOLN
INTRAMUSCULAR | Status: AC
  Filled 2021-07-23: qty 1

## 2021-07-23 NOTE — ED Provider Notes (Signed)
?[facility] ? ? ? ?CSN: [id number] ?Arrival date & time: 07/23/21  1638 ? ? ?  ? ?History   ?Chief Complaint ?Chief Complaint  ?Patient presents with  ? Abscess  ? ? ?HPI ?Julie Schwartz is a 46 y.o. female.  ? ?Patient reports tender, swollen area to right upper back has been present for the past couple of weeks.  She reports it started small like a "pimple" and has grown in size and is extremely tender.  She denies redness, swelling around the area, fevers, nausea/vomiting.  She reports that there is pain moving down into her upper right arm.  She has tried warm compresses for the pain and swelling without relief.  She denies any recent antibiotic use. ? ? ?Past Medical History:  ?Diagnosis Date  ? Anemia   ? Depression   ? Fibromyalgia   ? Headache(784.0) 11/28/2012  ? Iron deficiency anemia   ? Migraine   ? Myalgia   ? Myalgia and myositis, unspecified 11/28/2012  ? Obesity   ? Panic attack   ? ? ?Patient Active Problem List  ? Diagnosis Date Noted  ? Myalgia and myositis, unspecified 11/28/2012  ? Headache(784.0) 11/28/2012  ? Disturbance of skin sensation 11/28/2012  ? ? ?Past Surgical History:  ?Procedure Laterality Date  ? CARPAL TUNNEL RELEASE Right   ? CESAREAN SECTION    ? TUBAL LIGATION    ? ? ?OB History   ? ? Gravida  ?2  ? Para  ?2  ? Term  ?2  ? Preterm  ?   ? AB  ?   ? Living  ?2  ?  ? ? SAB  ?   ? IAB  ?   ? Ectopic  ?   ? Multiple  ?   ? Live Births  ?   ?   ?  ?  ? ? ? ?Home Medications   ? ?Prior to Admission medications   ?Medication Sig Start Date End Date Taking? Authorizing Provider  ?doxycycline (VIBRAMYCIN) 100 MG capsule Take 1 capsule (100 mg total) by mouth 2 (two) times daily. 07/23/21  Yes Valentino NoseMartinez, Atalia Litzinger A, NP  ?amitriptyline (ELAVIL) 25 MG tablet TK 2 TS PO HS 12/04/17   [provider]  ?brompheniramine-pseudoephedrine-DM 30-2-10 MG/5ML syrup Take 10 mLs by mouth 4 (four) times daily as needed. 08/14/20   Cuthriell, Delorise RoyalsJonathan D, PA-C  ?clobetasol ointment (TEMOVATE)  0.05 % Apply 1 application topically 2 (two) times daily as needed. Use for up to 1 week at a time as needed, avoid face and private areas with this medication 02/20/20   Particia NearingLane, Rachel Elizabeth, PA-C  ?cyclobenzaprine (FLEXERIL) 5 MG tablet Take 1 tablet (5 mg total) by mouth 3 (three) times daily as needed for muscle spasms. 08/06/15   Earley FavorSchulz, Gail, NP  ?fluticasone (FLONASE) 50 MCG/ACT nasal spray Place 1 spray into both nostrils 2 (two) times daily. 08/14/20   Cuthriell, Delorise RoyalsJonathan D, PA-C  ?gabapentin (NEURONTIN) 300 MG capsule TK 1 C PO UP TO QID 12/04/17   [provider]  ?ibuprofen (ADVIL,MOTRIN) 800 MG tablet Take 1 tablet (800 mg total) by mouth 3 (three) times daily. 01/18/16   Mesner, Barbara CowerJason, MD  ?lidocaine (XYLOCAINE) 2 % solution Use as directed 10 mLs in the mouth or throat every 4 (four) hours as needed (sore throat). 08/14/20   Cuthriell, Delorise RoyalsJonathan D, PA-C  ? ? ?Family History ?Family History  ?Problem Relation Age of Onset  ? Depression Father   ?  Breast cancer Maternal Grandmother   ? Arthritis/Rheumatoid Maternal Grandmother   ? Lupus Paternal Grandmother   ? ? ?Social History ?Social History  ? ?Tobacco Use  ? Smoking status: Never  ? Smokeless tobacco: Never  ?Vaping Use  ? Vaping Use: Never used  ?Substance Use Topics  ? Alcohol use: Yes  ?  Comment: occasionally  ? Drug use: No  ? ? ? ?Allergies   ?Patient has no known allergies. ? ? ?Review of Systems ?Review of Systems ?Per HPI ? ?Physical Exam ?Triage Vital Signs ?ED Triage Vitals  ?Enc Vitals Group  ?   BP 07/23/21 1712 (!) 146/95  ?   Pulse Rate 07/23/21 1712 73  ?   Resp 07/23/21 1712 18  ?   Temp 07/23/21 1712 99.4 ?F (37.4 ?C)  ?   Temp Source 07/23/21 1712 Oral  ?   SpO2 07/23/21 1712 99 %  ?   Weight 07/23/21 1711 210 lb 1.6 oz (95.3 kg)  ?   Height 07/23/21 1711 5\' 9"  (1.753 m)  ?   Head Circumference --   ?   Peak Flow --   ?   Pain Score 07/23/21 1710 10  ?   Pain Loc --   ?   Pain Edu? --   ?   Excl. in GC? --   ? ?No data  found. ? ?Updated Vital Signs ?BP (!) 146/95 (BP Location: Right Arm)   Pulse 73   Temp 99.4 ?F (37.4 ?C) (Oral)   Resp 18   Ht 5\' 9"  (1.753 m)   Wt 210 lb 1.6 oz (95.3 kg)   SpO2 99%   BMI 31.03 kg/m?  ? ?Visual Acuity ?Right Eye Distance:   ?Left Eye Distance:   ?Bilateral Distance:   ? ?Right Eye Near:   ?Left Eye Near:    ?Bilateral Near:    ? ?Physical Exam ?Vitals and nursing note reviewed.  ?Constitutional:   ?   General: She is not in acute distress. ?   Appearance: Normal appearance. She is not toxic-appearing.  ?Pulmonary:  ?   Effort: Pulmonary effort is normal. No respiratory distress.  ?Skin: ?   General: Skin is warm and dry.  ?   Capillary Refill: Capillary refill takes less than 2 seconds.  ?   Findings: Abscess present.  ? ?    ?   Comments: Fluctuant abscess to her right upper back and area marked.  The abscess is extremely tender to palpation.  No surrounding erythema, warmth.   ?Neurological:  ?   Mental Status: She is alert and oriented to person, place, and time.  ?   Motor: No weakness.  ?   Gait: Gait normal.  ?Psychiatric:     ?   Behavior: Behavior is cooperative.  ? ? ? ?UC Treatments / Results  ?Labs ?(all labs ordered are listed, but only abnormal results are displayed) ?Labs Reviewed - No data to display ? ?EKG ? ? ?Radiology ?No results found. ? ?Procedures ?Incision and Drainage ? ?Date/Time: 07/23/2021 8:57 PM ?Performed by: , NP ?Authorized by: 09/22/2021, NP  ? ?Consent:  ?  Consent obtained:  Verbal ?  Consent given by:  Patient ?  Risks discussed:  Bleeding, incomplete drainage, pain and infection ?  Alternatives discussed:  Alternative treatment ?Universal protocol:  ?  Procedure explained and questions answered to patient or proxy's satisfaction: yes   ?  Patient identity confirmed:  Verbally with patient ?Location:  ?  Type:  Abscess ?  Size:  2 cm x 2 cm ?  Location:  Trunk ?  Trunk location:  Back ?Pre-procedure details:  ?  Skin preparation:   Chlorhexidine ?Anesthesia:  ?  Anesthesia method:  Local infiltration ?  Local anesthetic:  Lidocaine 1% WITH epi ?Procedure type:  ?  Complexity:  Simple ?Procedure details:  ?  Ultrasound guidance: no   ?  Incision types:  Stab incision ?  Incision depth:  Dermal ?  Wound management:  Probed and deloculated and irrigated with saline ?  Drainage:  Bloody and serosanguinous ?  Drainage amount:  Moderate ?  Wound treatment:  Wound left open ?  Packing materials:  None ?Post-procedure details:  ?  Procedure completion:  Tolerated well, no immediate complications (including critical care time) ? ?Medications Ordered in UC ?Medications  ?acetaminophen (TYLENOL) tablet 975 mg (975 mg Oral Given 07/23/21 1815)  ? ? ?Initial Impression / Assessment and Plan / UC Course  ?I have reviewed the triage vital signs and the nursing notes. ? ?Pertinent labs & imaging results that were available during my care of the patient were reviewed by me and considered in my medical decision making (see chart for details). ? ?  ?Irrigation and debridement of abscess as above.  Will also cover with oral antibiotics to help clear up infection.  Start doxycyline 100 mg twice daily for 7 days.  Discussed wound care.  Return if symptoms persist or worsen despite treatment. ?Final Clinical Impressions(s) / UC Diagnoses  ? ?Final diagnoses:  ?Back abscess  ? ? ? ?Discharge Instructions   ? ?  ?- Please start the doxycycline 100 mg twice daily for 7 days for the abscess ?- You can use Tylenol/ibuprofen for the pain ?- If you notice increase in redness, swelling, warmth around the site, please return to have it checked  ? ? ? ?ED Prescriptions   ? ? Medication Sig Dispense Auth. Provider  ? doxycycline (VIBRAMYCIN) 100 MG capsule Take 1 capsule (100 mg total) by mouth 2 (two) times daily. 14 capsule Valentino Nose, NP  ? ?  ? ?PDMP not reviewed this encounter. ?  ?Valentino Nose, NP ?07/23/21 2059 ? ?

## 2021-07-23 NOTE — Discharge Instructions (Addendum)
-   Please start the doxycycline 100 mg twice daily for 7 days for the abscess ?- You can use Tylenol/ibuprofen for the pain ?- If you notice increase in redness, swelling, warmth around the site, please return to have it checked  ?

## 2021-07-23 NOTE — ED Triage Notes (Signed)
Pt reports an abscess on the back near right shoulder. Started as "small bump" 2 weeks ago and now has gotten progressively bigger. States area is tender to touch and clothing.  ?

## 2022-06-27 ENCOUNTER — Other Ambulatory Visit: Payer: Self-pay | Admitting: Internal Medicine

## 2022-06-27 DIAGNOSIS — E278 Other specified disorders of adrenal gland: Secondary | ICD-10-CM

## 2022-07-01 ENCOUNTER — Ambulatory Visit
Admission: RE | Admit: 2022-07-01 | Discharge: 2022-07-01 | Disposition: A | Discharge status: Home / Self Care | Payer: No Typology Code available for payment source | Source: Ambulatory Visit | Attending: Internal Medicine | Admitting: Internal Medicine

## 2022-07-01 ENCOUNTER — Other Ambulatory Visit: Payer: Self-pay | Admitting: Internal Medicine

## 2022-07-01 DIAGNOSIS — E278 Other specified disorders of adrenal gland: Secondary | ICD-10-CM

## 2022-07-01 MED ORDER — IOPAMIDOL (ISOVUE-370) INJECTION 76%
100.0000 mL | Freq: Once | INTRAVENOUS | Status: AC | PRN
  Administered 2022-07-01: 100 mL via INTRAVENOUS

## 2022-07-04 ENCOUNTER — Other Ambulatory Visit: Payer: Self-pay | Admitting: Internal Medicine

## 2022-07-04 ENCOUNTER — Ambulatory Visit
Admission: RE | Admit: 2022-07-04 | Discharge: 2022-07-04 | Disposition: A | Discharge status: Home / Self Care | Payer: No Typology Code available for payment source | Source: Ambulatory Visit | Attending: Internal Medicine | Admitting: Internal Medicine

## 2022-07-04 DIAGNOSIS — R109 Unspecified abdominal pain: Secondary | ICD-10-CM

## 2022-08-04 ENCOUNTER — Emergency Department (HOSPITAL_COMMUNITY)
Admission: EM | Admit: 2022-08-04 | Discharge: 2022-08-04 | Discharge status: Against Medical Advice | Payer: No Typology Code available for payment source | Attending: Emergency Medicine | Admitting: Emergency Medicine

## 2022-08-04 ENCOUNTER — Encounter (HOSPITAL_COMMUNITY): Payer: Self-pay

## 2022-08-04 DIAGNOSIS — R519 Headache, unspecified: Secondary | ICD-10-CM | POA: Diagnosis not present

## 2022-08-04 DIAGNOSIS — Z5321 Procedure and treatment not carried out due to patient leaving prior to being seen by health care provider: Secondary | ICD-10-CM | POA: Diagnosis not present

## 2022-08-04 NOTE — ED Triage Notes (Signed)
Pt presents with c/o headache that has been present for most of the day. Pt reports she was recently diagnosed with a tumor on her adrenal gland. Pt reports she was told by her PCP to come here.

## 2022-08-04 NOTE — ED Notes (Signed)
Patient no longer in Triage 2 room.

## 2022-08-04 NOTE — ED Notes (Signed)
Patient called x 3 without answer 

## 2022-08-05 ENCOUNTER — Encounter: Payer: Self-pay | Admitting: Internal Medicine

## 2022-08-05 ENCOUNTER — Ambulatory Visit
Admission: RE | Admit: 2022-08-05 | Discharge: 2022-08-05 | Disposition: A | Discharge status: Home / Self Care | Payer: No Typology Code available for payment source | Source: Ambulatory Visit | Attending: Internal Medicine | Admitting: Internal Medicine

## 2022-08-05 ENCOUNTER — Other Ambulatory Visit: Payer: Self-pay | Admitting: Internal Medicine

## 2022-08-05 DIAGNOSIS — G44209 Tension-type headache, unspecified, not intractable: Secondary | ICD-10-CM

## 2022-08-13 ENCOUNTER — Emergency Department (HOSPITAL_COMMUNITY)
Admission: EM | Admit: 2022-08-13 | Discharge: 2022-08-13 | Disposition: A | Discharge status: Home / Self Care | Payer: No Typology Code available for payment source | Attending: Emergency Medicine | Admitting: Emergency Medicine

## 2022-08-13 ENCOUNTER — Encounter (HOSPITAL_COMMUNITY): Payer: Self-pay

## 2022-08-13 ENCOUNTER — Other Ambulatory Visit: Payer: Self-pay

## 2022-08-13 DIAGNOSIS — R519 Headache, unspecified: Secondary | ICD-10-CM | POA: Diagnosis present

## 2022-08-13 LAB — CBC WITH DIFFERENTIAL/PLATELET
Abs Immature Granulocytes: 0.01 10*3/uL (ref 0.00–0.07)
Basophils Absolute: 0 10*3/uL (ref 0.0–0.1)
Basophils Relative: 1 %
Eosinophils Absolute: 0.1 10*3/uL (ref 0.0–0.5)
Eosinophils Relative: 2 %
HCT: 35.5 % — ABNORMAL LOW (ref 36.0–46.0)
Hemoglobin: 11.3 g/dL — ABNORMAL LOW (ref 12.0–15.0)
Immature Granulocytes: 0 %
Lymphocytes Relative: 47 %
Lymphs Abs: 2.5 10*3/uL (ref 0.7–4.0)
MCH: 31.2 pg (ref 26.0–34.0)
MCHC: 31.8 g/dL (ref 30.0–36.0)
MCV: 98.1 fL (ref 80.0–100.0)
Monocytes Absolute: 0.4 10*3/uL (ref 0.1–1.0)
Monocytes Relative: 7 %
Neutro Abs: 2.3 10*3/uL (ref 1.7–7.7)
Neutrophils Relative %: 43 %
Platelets: 286 10*3/uL (ref 150–400)
RBC: 3.62 MIL/uL — ABNORMAL LOW (ref 3.87–5.11)
RDW: 12 % (ref 11.5–15.5)
WBC: 5.3 10*3/uL (ref 4.0–10.5)
nRBC: 0 % (ref 0.0–0.2)

## 2022-08-13 LAB — COMPREHENSIVE METABOLIC PANEL
ALT: 15 U/L (ref 0–44)
AST: 16 U/L (ref 15–41)
Albumin: 3.9 g/dL (ref 3.5–5.0)
Alkaline Phosphatase: 38 U/L (ref 38–126)
Anion gap: 7 (ref 5–15)
BUN: 18 mg/dL (ref 6–20)
CO2: 25 mmol/L (ref 22–32)
Calcium: 9 mg/dL (ref 8.9–10.3)
Chloride: 104 mmol/L (ref 98–111)
Creatinine, Ser: 1.35 mg/dL — ABNORMAL HIGH (ref 0.44–1.00)
GFR, Estimated: 50 mL/min — ABNORMAL LOW (ref >60)
Glucose, Bld: 118 mg/dL — ABNORMAL HIGH (ref 70–99)
Potassium: 4 mmol/L (ref 3.5–5.1)
Sodium: 136 mmol/L (ref 135–145)
Total Bilirubin: 0.7 mg/dL (ref 0.3–1.2)
Total Protein: 7.9 g/dL (ref 6.5–8.1)

## 2022-08-13 LAB — I-STAT BETA HCG BLOOD, ED (MC, WL, AP ONLY): I-stat hCG, quantitative: <5 m[IU]/mL (ref <5)

## 2022-08-13 MED ORDER — KETOROLAC TROMETHAMINE 15 MG/ML IJ SOLN
15.0000 mg | Freq: Once | INTRAMUSCULAR | Status: AC
  Administered 2022-08-13: 15 mg via INTRAVENOUS
  Filled 2022-08-13: qty 1

## 2022-08-13 MED ORDER — METOCLOPRAMIDE HCL 5 MG/ML IJ SOLN
10.0000 mg | Freq: Once | INTRAMUSCULAR | Status: AC
  Administered 2022-08-13: 10 mg via INTRAVENOUS
  Filled 2022-08-13: qty 2

## 2022-08-13 MED ORDER — SODIUM CHLORIDE 0.9 % IV BOLUS
1000.0000 mL | Freq: Once | INTRAVENOUS | Status: AC
  Administered 2022-08-13: 1000 mL via INTRAVENOUS

## 2022-08-13 MED ORDER — DIPHENHYDRAMINE HCL 50 MG/ML IJ SOLN
12.5000 mg | Freq: Once | INTRAMUSCULAR | Status: AC
  Administered 2022-08-13: 12.5 mg via INTRAVENOUS
  Filled 2022-08-13: qty 1

## 2022-08-13 NOTE — Discharge Instructions (Addendum)
You left the emergency department prior to me being able to see you.  This is very dangerous.  Return to the emergency department with new or worsening symptoms.

## 2022-08-13 NOTE — ED Provider Notes (Signed)
EMERGENCY DEPARTMENT AT [facility] Provider Note   CSN: [id number] Arrival date & time: 08/13/22  0551     History  Chief Complaint  Patient presents with   Migraine    Julie Schwartz is a 47 y.o. female.  With history of anxiety, depression, fibromyalgia, iron deficiency anemia, pseudotumor cerebri, pheochromocytoma who presents to the ED for evaluation of a headache.  She states she has had waxing and waning headaches for the past "couple weeks."  Initially presented to her PCP regarding this and had imaging recently that showed partially empty sella turcica.  Was encouraged to alternate Tylenol and ibuprofen at home for headaches and to present to the ED if her symptoms do not improve.  She has follow-up with her PCP on Tuesday and with neurology on Thursday regarding this new finding.  She states that she took Tylenol and ibuprofen this morning with no improvement in her symptoms.  Her headache is localized in a headband distribution across the forehead and described as a squeezing sensation.  She reports that she discontinued all alcohol and caffeine use approximately 1 month ago just prior to symptoms beginning.  She also reports intermittent blurred vision.  This occurs at random, lasts a few minutes and then resolve spontaneously.  She does not have any blurred vision at this time.  She denies double vision or tinnitus.  Denies numbness, weakness or tingling, nausea, vomiting, fevers, neck stiffness.   Migraine Associated symptoms include headaches.       Home Medications Prior to Admission medications   Medication Sig Start Date End Date Taking? Authorizing Provider  amitriptyline (ELAVIL) 25 MG tablet TK 2 TS PO HS 12/04/17   [provider]  brompheniramine-pseudoephedrine-DM 30-2-10 MG/5ML syrup Take 10 mLs by mouth 4 (four) times daily as needed. 08/14/20   Cuthriell, Delorise Royals, PA-C  clobetasol ointment (TEMOVATE) 0.05 % Apply 1 application  topically 2 (two) times daily as needed. Use for up to 1 week at a time as needed, avoid face and private areas with this medication 02/20/20   Particia Nearing, PA-C  cyclobenzaprine (FLEXERIL) 5 MG tablet Take 1 tablet (5 mg total) by mouth 3 (three) times daily as needed for muscle spasms. 08/06/15   Earley Favor, NP  doxycycline (VIBRAMYCIN) 100 MG capsule Take 1 capsule (100 mg total) by mouth 2 (two) times daily. 07/23/21   Valentino Nose, NP  fluticasone (FLONASE) 50 MCG/ACT nasal spray Place 1 spray into both nostrils 2 (two) times daily. 08/14/20   Cuthriell, Delorise Royals, PA-C  gabapentin (NEURONTIN) 300 MG capsule TK 1 C PO UP TO QID 12/04/17   [provider]  ibuprofen (ADVIL,MOTRIN) 800 MG tablet Take 1 tablet (800 mg total) by mouth 3 (three) times daily. 01/18/16   Mesner, Barbara Cower, MD  lidocaine (XYLOCAINE) 2 % solution Use as directed 10 mLs in the mouth or throat every 4 (four) hours as needed (sore throat). 08/14/20   Cuthriell, Delorise Royals, PA-C      Allergies    Patient has no known allergies.    Review of Systems   Review of Systems  Neurological:  Positive for headaches.  All other systems reviewed and are negative.   Physical Exam Updated Vital Signs BP 121/69   Pulse 74   Temp 98 F (36.7 C) (Oral)   Resp 15   Ht 5\' 9"  (1.753 m)   Wt 95.3 kg   LMP 07/24/2022 (Approximate)   SpO2 100%  BMI 31.01 kg/m  Physical Exam Vitals and nursing note reviewed.  Constitutional:      General: She is not in acute distress.    Appearance: She is well-developed. She is not ill-appearing, toxic-appearing or diaphoretic.     Comments: Resting comfortably in bed  HENT:     Head: Normocephalic and atraumatic.  Eyes:     Extraocular Movements: Extraocular movements intact.     Conjunctiva/sclera: Conjunctivae normal.     Pupils: Pupils are equal, round, and reactive to light.  Cardiovascular:     Rate and Rhythm: Normal rate and regular rhythm.     Heart  sounds: No murmur heard. Pulmonary:     Effort: Pulmonary effort is normal. No respiratory distress.     Breath sounds: Normal breath sounds.  Abdominal:     Palpations: Abdomen is soft.     Tenderness: There is no abdominal tenderness.  Musculoskeletal:        General: No swelling.     Cervical back: Neck supple. No rigidity.  Skin:    General: Skin is warm and dry.     Capillary Refill: Capillary refill takes less than 2 seconds.  Neurological:     General: No focal deficit present.     Mental Status: She is alert and oriented to person, place, and time.     Comments:   MENTAL STATUS: AAOx3   LANG/SPEECH: Fluent, intact naming, repetition & comprehension   CRANIAL NERVES:   II: Pupils equal and reactive   III, IV, VI: EOM intact, no gaze preference or deviation, no nystagmus   V: normal sensation of the face   VII: no facial asymmetry   VIII: normal hearing to speech   MOTOR: 5/5 in both upper and lower extremities   SENSORY: Normal to touch in all extremiteis   COORD: Normal finger to nose, heel to shin and shoulder shrug, no tremor, no dysmetria. No pronator drift   Psychiatric:        Mood and Affect: Mood normal.        Behavior: Behavior normal.     ED Results / Procedures / Treatments   Labs (all labs ordered are listed, but only abnormal results are displayed) Labs Reviewed  CBC WITH DIFFERENTIAL/PLATELET - Abnormal; Notable for the following components:      Result Value   RBC 3.62 (*)    Hemoglobin 11.3 (*)    HCT 35.5 (*)    All other components within normal limits  COMPREHENSIVE METABOLIC PANEL - Abnormal; Notable for the following components:   Glucose, Bld 118 (*)    Creatinine, Ser 1.35 (*)    GFR, Estimated 50 (*)    All other components within normal limits  I-STAT BETA HCG BLOOD, ED ([facility], [facility], [facility] ONLY)    EKG EKG Interpretation  Date/Time:  Saturday Aug 13 2022 06:13:51 EDT Ventricular Rate:  67 PR Interval:  149 QRS Duration: 84 QT  Interval:  393 QTC Calculation: 415 R Axis:   17 Text Interpretation: S inus rhythm Confirmed by Coralee Pesa ([id number]) on 08/13/2022 7:42:56 AM  Radiology No results found.  Procedures Procedures    Medications Ordered in ED Medications  sodium chloride 0.9 % bolus 1,000 mL (0 mLs Intravenous Stopped 08/13/22 0921)  metoCLOPramide (REGLAN) injection 10 mg (10 mg Intravenous Given 08/13/22 0747)  ketorolac (TORADOL) 15 MG/ML injection 15 mg (15 mg Intravenous Given 08/13/22 0747)  diphenhydrAMINE (BENADRYL) injection 12.5 mg (12.5 mg Intravenous Given 08/13/22 0747)  ED Course/ Medical Decision Making/ A&P                             Medical Decision Making Risk Prescription drug management.  This patient presents to the ED for concern of headache, this involves an extensive number of treatment options, and is a complaint that carries with it a high risk of complications and morbidity. Emergent considerations for headache include subarachnoid hemorrhage, meningitis, temporal arteritis, glaucoma, cerebral ischemia, carotid/vertebral dissection, intracranial tumor, Venous sinus thrombosis, carbon monoxide poisoning, acute or chronic subdural hemorrhage.  Other considerations include: Migraine, Cluster headache, Hypertension, Caffeine, alcohol, or drug withdrawal, Pseudotumor cerebri, Arteriovenous malformation, Head injury, Neurocysticercosis, Post-lumbar puncture, Preeclampsia, Tension headache, Sinusitis, Cervical arthritis, Refractive error causing strain, Dental abscess, Otitis media, Temporomandibular joint syndrome, Depression, Somatoform disorder (eg, somatization) Trigeminal neuralgia, Glossopharyngeal neuralgia.   Co morbidities that complicate the patient evaluation   anxiety, depression, fibromyalgia, iron deficiency anemia, pseudotumor cerebri, pheochromocytoma  My initial workup includes labs, symptom control  Additional history obtained from: Nursing notes from this  visit. CT head from 08/05/2022 which revealed partial empty sella  I ordered, reviewed and interpreted labs which include: hCG, CBC, CMP.  Hyperglycemia of 118, slightly elevated creatinine of 1.35.  Stable anemia with a hemoglobin of 11.3.  Afebrile, hemodynamically stable.  47 year old female presenting to the ED for evaluation of a headache.  Described in a headband distribution and as a squeezing sensation.  Recently underwent CT of the head which showed possible pseudotumor cerebri versus idiopathic intracranial hypertension.  Has follow-up with her PCP on Tuesday and neurology on Thursday.  Headache is likely multifactorial with her cessation of caffeine use recently and her recent CT findings, however her headache is consistent with a tension type headache.  No meningismus or focal neurologic deficits.  Patient reports significant improvement in her symptoms while in the ED.  Prior to being able to discharge the patient, she eloped from the department.  I was not informed of this prior to the patient being gone from the department.  I was unable to discuss return precautions with the patient.  Note: Portions of this report may have been transcribed using voice recognition software. Every effort was made to ensure accuracy; however, inadvertent computerized transcription errors may still be present.        Final Clinical Impression(s) / ED Diagnoses Final diagnoses:  Bad headache    Rx / DC Orders ED Discharge Orders     None         Michelle Piper, PA-C 08/13/22 [id number]    Rozelle Logan, DO 08/13/22 1021

## 2022-08-13 NOTE — ED Triage Notes (Signed)
Patient reports headache and blurry vision starting at 2100 last night. Took ibuprofen and tylenol without relief. Patient seen recently for new onset of headache and was dx with possible idiopathic intracranial hypertension. She was told to come back if the headache did not improve with ibuprofen or tylenol.

## 2022-08-13 NOTE — ED Notes (Signed)
Patient was seen walking down hall and leaving ER. She had taken her IV out and was leaving prior to discharge. Patient did not sign AMA, but left AMA before MD could could go in to reassess patient. JRPRN

## 2022-08-16 ENCOUNTER — Encounter: Payer: Self-pay | Admitting: Neurology

## 2022-09-19 ENCOUNTER — Ambulatory Visit (INDEPENDENT_AMBULATORY_CARE_PROVIDER_SITE_OTHER): Payer: No Typology Code available for payment source | Admitting: Neurology

## 2022-09-19 ENCOUNTER — Encounter: Payer: Self-pay | Admitting: Neurology

## 2022-09-19 VITALS — BP 147/65 | HR 81 | Ht 69.0 in | Wt 227.0 lb

## 2022-09-19 DIAGNOSIS — G932 Benign intracranial hypertension: Secondary | ICD-10-CM

## 2022-09-19 MED ORDER — ACETAZOLAMIDE 250 MG PO TABS
250.0000 mg | ORAL_TABLET | Freq: Two times a day (BID) | ORAL | 3 refills | Status: DC
Start: 2022-09-19 — End: 2023-05-16

## 2022-09-19 NOTE — Progress Notes (Signed)
[facility] Clinic Note - Initial Visit   Date: 09/19/2022   Julie Schwartz MRN: [id number] DOB: November 14, 1975   Dear Dr. Donette Larry:  Thank you for your kind referral of Julie Schwartz for consultation of headaches. Although her history is well known to you, please allow [place] to reiterate it for the purpose of our medical record. The patient was accompanied to the clinic by self.     Julie Schwartz is a 47 y.o. right-handed female with hypertension, right CTS s/p release, pseudotumor cerebri (2014), pheochromycytoma, and fibromyalgia presenting for evaluation of headaches.   IMPRESSION/PLAN: Idiopathic intracranial hypertension, diagnosed in 2014 by LP with OP 26.90mmHg.  CT head from 07/2022 shows partially empty sella turcica which can be seen with IIH.  She denies vision complaints and did not have papilledema on recent eye exam.  She has normal funduscopic exam with me.    - I will start diamox 250mg  twice daily and titrate as needed  - Patient to provide update in 1 month  Return to clinic in 2-3 months  ------------------------------------------------------------- History of present illness: Starting around April 2024, she began having pain involving the bifrontal and base of the neck, as if she was wearing a visor.  Pain is described as dull, pressure, tight pain. It can last anywhere from several hours to all days.  Headaches occur almost daily for the past month.  She endorses photophobia, phonophobia, nausea.  She reports having headaches in the past which were holocephalic.  She has tried tylenol, NSAIDs which does not provide relief.    She has blurred vision and had eye exam which was normal three weeks ago. Specifically, no papilledema.  Because of light sensitivity, she was recommended rose tine eye glasses.  Of note, she has history of idiopathic intracranial hypertension which was diagnosed by LP in 2014 where opening pressure was  26.5.  Out-side paper records, electronic medical record, and images have been reviewed where available and summarized as:  CT head wo contrast 08/05/2022: 1. No acute intracranial process. 2. Partial empty sella, which can be seen in the setting of idiopathic intracranial hypertension.  Lab Results  Component Value Date   TSH 0.741 08/24/2012   Lab Results  Component Value Date   ESRSEDRATE 22 08/24/2012    Past Medical History:  Diagnosis Date   Anemia    Depression    Fibromyalgia    Headache(784.0) 11/28/2012   Iron deficiency anemia    Migraine    Myalgia    Myalgia and myositis, unspecified 11/28/2012   Obesity    Panic attack     Past Surgical History:  Procedure Laterality Date   CARPAL TUNNEL RELEASE Right    CESAREAN SECTION     TUBAL LIGATION       Medications:  Outpatient Encounter Medications as of 09/19/2022  Medication Sig   amitriptyline (ELAVIL) 25 MG tablet TK 2 TS PO HS   amLODipine (NORVASC) 10 MG tablet Take 1 tablet by mouth in the morning and at bedtime.   doxazosin (CARDURA) 1 MG tablet Take 1 tablet by mouth 2 (two) times daily.   ibuprofen (ADVIL,MOTRIN) 800 MG tablet Take 1 tablet (800 mg total) by mouth 3 (three) times daily.   lisinopril (ZESTRIL) 10 MG tablet Take 1 tablet by mouth daily.   propranolol ER (INDERAL LA) 120 MG 24 hr capsule Take 160 mg by mouth daily.   brompheniramine-pseudoephedrine-DM 30-2-10 MG/5ML syrup Take 10 mLs by mouth 4 (four)  times daily as needed.   clobetasol ointment (TEMOVATE) 0.05 % Apply 1 application topically 2 (two) times daily as needed. Use for up to 1 week at a time as needed, avoid face and private areas with this medication   cyclobenzaprine (FLEXERIL) 5 MG tablet Take 1 tablet (5 mg total) by mouth 3 (three) times daily as needed for muscle spasms.   doxycycline (VIBRAMYCIN) 100 MG capsule Take 1 capsule (100 mg total) by mouth 2 (two) times daily.   fluticasone (FLONASE) 50 MCG/ACT nasal spray Place  1 spray into both nostrils 2 (two) times daily.   gabapentin (NEURONTIN) 300 MG capsule TK 1 C PO UP TO QID (Patient not taking: Reported on 09/19/2022)   lidocaine (XYLOCAINE) 2 % solution Use as directed 10 mLs in the mouth or throat every 4 (four) hours as needed (sore throat).   No facility-administered encounter medications on file as of 09/19/2022.    Allergies: No Known Allergies  Family History: Family History  Problem Relation Age of Onset   Cancer - Lung Mother    Depression Father    Lupus Paternal Aunt    Breast cancer Maternal Grandmother    Arthritis/Rheumatoid Maternal Grandmother    Other Maternal Grandmother        Kidney Issues   Other Maternal Grandfather    Hypertension Maternal Grandfather     Social History: Social History   Tobacco Use   Smoking status: Never   Smokeless tobacco: Never  Vaping Use   Vaping Use: Never used  Substance Use Topics   Alcohol use: Yes    Comment: occasionally   Drug use: No   Social History   Social History Narrative   Lives with husband and children in a one story home.  Works as a Scientific laboratory technician.  Education: 2 college degrees.       Right Handed     Vital Signs:  BP (!) 147/65   Pulse 81   Ht 5\' 9"  (1.753 m)   Wt 227 lb (103 kg)   SpO2 98%   BMI 33.52 kg/m   Neurological Exam: MENTAL STATUS including orientation to time, place, person, recent and remote memory, attention span and concentration, language, and fund of knowledge is normal.  Speech is not dysarthric.  CRANIAL NERVES: II:  No visual field defects.   Normal funduscopic exam. III-IV-VI: Pupils equal round and reactive to light.  Normal conjugate, extra-ocular eye movements in all directions of gaze.  No nystagmus.  No ptosis.   V:  Normal facial sensation.    VII:  Normal facial symmetry and movements.   VIII:  Normal hearing and vestibular function.   IX-X:  Normal palatal movement.   XI:  Normal shoulder shrug and head rotation.   XII:   Normal tongue strength and range of motion, no deviation or fasciculation.  MOTOR:  Motor strength is 5/5 throughout.  No atrophy, fasciculations or abnormal movements.  No pronator drift.  MSRs:                                           Right        Left brachioradialis 2+  2+  biceps 2+  2+  triceps 2+  2+  patellar 2+  2+  ankle jerk 2+  2+  Hoffman no  no  plantar response down  down   SENSORY:  Normal and symmetric perception of light touch, vibration and temperature.  Romberg's sign absent.   COORDINATION/GAIT: Normal finger-to- nose-finger.  Intact rapid alternating movements bilaterally.  Able to rise from a chair without using arms.  Gait narrow based and stable. Tandem and stressed gait intact.     Thank you for allowing me to participate in patient's care.  If I can answer any additional questions, I would be pleased to do so.    Sincerely,    Modelle Vollmer K. Allena Katz, DO

## 2022-09-19 NOTE — Patient Instructions (Signed)
Start diamox 250mg  twice daily  I will see you back 2-3 months

## 2022-09-26 ENCOUNTER — Ambulatory Visit: Payer: No Typology Code available for payment source | Admitting: Neurology

## 2022-10-04 ENCOUNTER — Emergency Department (HOSPITAL_BASED_OUTPATIENT_CLINIC_OR_DEPARTMENT_OTHER): Payer: No Typology Code available for payment source

## 2022-10-04 ENCOUNTER — Other Ambulatory Visit: Payer: Self-pay

## 2022-10-04 ENCOUNTER — Other Ambulatory Visit (HOSPITAL_BASED_OUTPATIENT_CLINIC_OR_DEPARTMENT_OTHER): Payer: Self-pay

## 2022-10-04 ENCOUNTER — Emergency Department (HOSPITAL_BASED_OUTPATIENT_CLINIC_OR_DEPARTMENT_OTHER)
Admission: EM | Admit: 2022-10-04 | Discharge: 2022-10-04 | Disposition: A | Discharge status: Home / Self Care | Payer: No Typology Code available for payment source | Attending: Emergency Medicine | Admitting: Emergency Medicine

## 2022-10-04 ENCOUNTER — Encounter (HOSPITAL_BASED_OUTPATIENT_CLINIC_OR_DEPARTMENT_OTHER): Payer: Self-pay | Admitting: Emergency Medicine

## 2022-10-04 DIAGNOSIS — G932 Benign intracranial hypertension: Secondary | ICD-10-CM

## 2022-10-04 DIAGNOSIS — G4489 Other headache syndrome: Secondary | ICD-10-CM

## 2022-10-04 DIAGNOSIS — R202 Paresthesia of skin: Secondary | ICD-10-CM | POA: Diagnosis not present

## 2022-10-04 DIAGNOSIS — R519 Headache, unspecified: Secondary | ICD-10-CM | POA: Diagnosis present

## 2022-10-04 DIAGNOSIS — Z79899 Other long term (current) drug therapy: Secondary | ICD-10-CM | POA: Insufficient documentation

## 2022-10-04 DIAGNOSIS — I10 Essential (primary) hypertension: Secondary | ICD-10-CM | POA: Insufficient documentation

## 2022-10-04 DIAGNOSIS — Z20822 Contact with and (suspected) exposure to covid-19: Secondary | ICD-10-CM | POA: Diagnosis not present

## 2022-10-04 HISTORY — DX: Essential (primary) hypertension: I10

## 2022-10-04 LAB — CBC WITH DIFFERENTIAL/PLATELET
Abs Immature Granulocytes: 0.01 10*3/uL (ref 0.00–0.07)
Basophils Absolute: 0 10*3/uL (ref 0.0–0.1)
Basophils Relative: 1 %
Eosinophils Absolute: 0.1 10*3/uL (ref 0.0–0.5)
Eosinophils Relative: 2 %
HCT: 34 % — ABNORMAL LOW (ref 36.0–46.0)
Hemoglobin: 11.5 g/dL — ABNORMAL LOW (ref 12.0–15.0)
Immature Granulocytes: 0 %
Lymphocytes Relative: 58 %
Lymphs Abs: 2.3 10*3/uL (ref 0.7–4.0)
MCH: 31.6 pg (ref 26.0–34.0)
MCHC: 33.8 g/dL (ref 30.0–36.0)
MCV: 93.4 fL (ref 80.0–100.0)
Monocytes Absolute: 0.3 10*3/uL (ref 0.1–1.0)
Monocytes Relative: 7 %
Neutro Abs: 1.3 10*3/uL — ABNORMAL LOW (ref 1.7–7.7)
Neutrophils Relative %: 32 %
Platelets: 275 10*3/uL (ref 150–400)
RBC: 3.64 MIL/uL — ABNORMAL LOW (ref 3.87–5.11)
RDW: 12.2 % (ref 11.5–15.5)
WBC: 4 10*3/uL (ref 4.0–10.5)
nRBC: 0 % (ref 0.0–0.2)

## 2022-10-04 LAB — URINALYSIS, ROUTINE W REFLEX MICROSCOPIC
Bilirubin Urine: NEGATIVE
Glucose, UA: NEGATIVE mg/dL
Hgb urine dipstick: NEGATIVE
Ketones, ur: NEGATIVE mg/dL
Leukocytes,Ua: NEGATIVE
Nitrite: NEGATIVE
Protein, ur: NEGATIVE mg/dL
Specific Gravity, Urine: 1.01 (ref 1.005–1.030)
pH: 8.5 — ABNORMAL HIGH (ref 5.0–8.0)

## 2022-10-04 LAB — BASIC METABOLIC PANEL
Anion gap: 7 (ref 5–15)
BUN: 9 mg/dL (ref 6–20)
CO2: 24 mmol/L (ref 22–32)
Calcium: 9.4 mg/dL (ref 8.9–10.3)
Chloride: 108 mmol/L (ref 98–111)
Creatinine, Ser: 1.04 mg/dL — ABNORMAL HIGH (ref 0.44–1.00)
GFR, Estimated: >60 mL/min (ref >60)
Glucose, Bld: 122 mg/dL — ABNORMAL HIGH (ref 70–99)
Potassium: 3.5 mmol/L (ref 3.5–5.1)
Sodium: 139 mmol/L (ref 135–145)

## 2022-10-04 LAB — HCG, SERUM, QUALITATIVE: Preg, Serum: NEGATIVE

## 2022-10-04 LAB — SARS CORONAVIRUS 2 BY RT PCR: SARS Coronavirus 2 by RT PCR: NEGATIVE

## 2022-10-04 MED ORDER — KETOROLAC TROMETHAMINE 15 MG/ML IJ SOLN
15.0000 mg | Freq: Once | INTRAMUSCULAR | Status: DC
  Filled 2022-10-04: qty 1

## 2022-10-04 MED ORDER — DEXAMETHASONE SODIUM PHOSPHATE 10 MG/ML IJ SOLN
10.0000 mg | Freq: Once | INTRAMUSCULAR | Status: DC
  Filled 2022-10-04: qty 1

## 2022-10-04 MED ORDER — SODIUM CHLORIDE 0.9 % IV BOLUS
1000.0000 mL | Freq: Once | INTRAVENOUS | Status: AC
  Administered 2022-10-04: 1000 mL via INTRAVENOUS

## 2022-10-04 MED ORDER — DIPHENHYDRAMINE HCL 50 MG/ML IJ SOLN
25.0000 mg | Freq: Once | INTRAMUSCULAR | Status: DC
  Filled 2022-10-04: qty 1

## 2022-10-04 MED ORDER — METOCLOPRAMIDE HCL 5 MG/ML IJ SOLN
10.0000 mg | Freq: Once | INTRAMUSCULAR | Status: DC
  Filled 2022-10-04: qty 2

## 2022-10-04 NOTE — ED Notes (Signed)
Patient verbalizes understanding of discharge instructions. Opportunity for questioning and answers were provided. Patient discharged from ED.  °

## 2022-10-04 NOTE — ED Triage Notes (Signed)
Pt states she has been suffering with headaches for months, is under the care of neurologist Dr. Allena Katz, has had MRI/CT in past,dx with [facility], she does have dizziness with these. But today around 0200 she was woken with the pain and dizziness, but her face feels numb/tingly and her body feels heavy. These are new symptoms.

## 2022-10-04 NOTE — ED Triage Notes (Signed)
Went to bed 930 last night with headache but no tingling. Last seen by someone else 2 days ago.

## 2022-10-04 NOTE — Discharge Instructions (Signed)
Thank you for letting us take care of you today.  Your labs and imaging were reassuring and we do not see any new findings.  Please follow-up with your neurologist as soon as possible to discuss better daily control of her headaches.  You may continue to take your home medications as prescribed by your outpatient team.  You may take Excedrin migraine as discussed.  If you want to add additional over-the-counter medications to this, please see the Tylenol/acetaminophen component of the Excedrin package that you are using.  You may take additional Tylenol based on the milligram of the Excedrin you are using.  You can take up to 1000 mg Tylenol every 6 hours but should not take more than 4000 mg in 24 hours including Tylenol from them by medications such as Excedrin or by itself.  For new or worsening condition, return to the nearest ED for reevaluation or treatment of your headache as desired.

## 2022-10-04 NOTE — ED Provider Notes (Signed)
[place] EMERGENCY DEPARTMENT AT [facility] Provider Note   CSN: [id number] Arrival date & time: 10/04/22  0849     History    Julie Schwartz is a 47 y.o. female with past medical history idiopathic intracranial hypertension, iron deficiency anemia, hypertension who presents to the ED complaining of a severe headache that started last night.  She describes it as very intense and located to the bilateral frontal scalp and associated with near syncope and tingling to the medial aspect of both cheeks.  States that she can have headaches every day due to her history of IIH but this feels more severe than normal.  States that the tingling is new.  Does also state that she has bilateral blurred vision but this has been ongoing for about 2 months now.  Last CT was in May 2024 and had no acute findings.  Patient takes Diamox to 50 mg twice daily for her IIH as well as has been using gross glasses as recommended by her outpatient neurologist.  Associated nausea but no vomiting, chest pain, shortness of breath, or abdominal pain.      Home Medications Prior to Admission medications   Medication Sig Start Date End Date Taking? Authorizing Provider  acetaZOLAMIDE (DIAMOX) 250 MG tablet Take 1 tablet (250 mg total) by mouth 2 (two) times daily. 09/19/22   Nita Sickle K, DO  amitriptyline (ELAVIL) 25 MG tablet TK 2 TS PO HS 12/04/17   [provider]  amLODipine (NORVASC) 10 MG tablet Take 1 tablet by mouth in the morning and at bedtime. 07/18/22   [provider]  brompheniramine-pseudoephedrine-DM 30-2-10 MG/5ML syrup Take 10 mLs by mouth 4 (four) times daily as needed. 08/14/20   Cuthriell, Delorise Royals, PA-C  clobetasol ointment (TEMOVATE) 0.05 % Apply 1 application topically 2 (two) times daily as needed. Use for up to 1 week at a time as needed, avoid face and private areas with this medication 02/20/20   Particia Nearing, PA-C  cyclobenzaprine (FLEXERIL) 5 MG tablet  Take 1 tablet (5 mg total) by mouth 3 (three) times daily as needed for muscle spasms. 08/06/15   Earley Favor, NP  doxazosin (CARDURA) 1 MG tablet Take 1 tablet by mouth 2 (two) times daily. 07/22/22   [provider]  doxycycline (VIBRAMYCIN) 100 MG capsule Take 1 capsule (100 mg total) by mouth 2 (two) times daily. 07/23/21   Valentino Nose, NP  fluticasone (FLONASE) 50 MCG/ACT nasal spray Place 1 spray into both nostrils 2 (two) times daily. 08/14/20   Cuthriell, Delorise Royals, PA-C  ibuprofen (ADVIL,MOTRIN) 800 MG tablet Take 1 tablet (800 mg total) by mouth 3 (three) times daily. 01/18/16   Mesner, Barbara Cower, MD  lidocaine (XYLOCAINE) 2 % solution Use as directed 10 mLs in the mouth or throat every 4 (four) hours as needed (sore throat). 08/14/20   Cuthriell, Delorise Royals, PA-C  lisinopril (ZESTRIL) 10 MG tablet Take 1 tablet by mouth daily. 06/28/22   [provider]  propranolol ER (INDERAL LA) 120 MG 24 hr capsule Take 160 mg by mouth daily. 08/24/22   [provider]      Allergies    Lyrica [pregabalin]    Review of Systems   Review of Systems  All other systems reviewed and are negative.   Physical Exam Updated Vital Signs BP (!) 162/98   Pulse 79   Temp 98.3 F (36.8 C) (Oral)   Resp 20   Wt 108 kg  SpO2 99%   BMI 35.15 kg/m  Physical Exam Vitals and nursing note reviewed.  Constitutional:      General: She is not in acute distress.    Appearance: Normal appearance.     Comments: Generally weak  HENT:     Head: Normocephalic and atraumatic.     Mouth/Throat:     Mouth: Mucous membranes are moist.  Eyes:     General: Lids are normal. Gaze aligned appropriately. No scleral icterus.    Extraocular Movements: Extraocular movements intact.     Right eye: No nystagmus.     Left eye: No nystagmus.     Conjunctiva/sclera: Conjunctivae normal.     Pupils: Pupils are equal, round, and reactive to light.     Right eye: Pupil is not sluggish.     Left eye:  Pupil is not sluggish.     Funduscopic exam:    Right eye: No AV nicking or papilledema. Red reflex present.        Left eye: No AV nicking or papilledema. Red reflex present. Cardiovascular:     Rate and Rhythm: Normal rate and regular rhythm.     Heart sounds: No murmur heard. Pulmonary:     Effort: Pulmonary effort is normal.     Breath sounds: Normal breath sounds.  Abdominal:     General: Abdomen is flat.     Palpations: Abdomen is soft.     Tenderness: There is no abdominal tenderness.  Musculoskeletal:        General: No deformity.     Cervical back: Neck supple.     Right lower leg: No edema.     Left lower leg: No edema.     Comments: Generally weak but symmetric strength bilaterally  Skin:    General: Skin is warm and dry.     Capillary Refill: Capillary refill takes less than 2 seconds.  Neurological:     General: No focal deficit present.     Mental Status: She is alert and oriented to person, place, and time.     GCS: GCS eye subscore is 4. GCS verbal subscore is 5. GCS motor subscore is 6.     Cranial Nerves: Cranial nerves 2-12 are intact. No cranial nerve deficit, dysarthria or facial asymmetry.     Motor: Motor function is intact. No tremor, atrophy or abnormal muscle tone.     Comments: Tactile sensation to light touch grossly intact bilaterally  Psychiatric:        Mood and Affect: Affect is tearful.        Speech: Speech normal.        Behavior: Behavior is cooperative.     ED Results / Procedures / Treatments   Labs (all labs ordered are listed, but only abnormal results are displayed) Labs Reviewed  CBC WITH DIFFERENTIAL/PLATELET - Abnormal; Notable for the following components:      Result Value   RBC 3.64 (*)    Hemoglobin 11.5 (*)    HCT 34.0 (*)    Neutro Abs 1.3 (*)    All other components within normal limits  BASIC METABOLIC PANEL - Abnormal; Notable for the following components:   Glucose, Bld 122 (*)    Creatinine, Ser 1.04 (*)    All  other components within normal limits  URINALYSIS, ROUTINE W REFLEX MICROSCOPIC - Abnormal; Notable for the following components:   Color, Urine COLORLESS (*)    pH 8.5 (*)    All other components within normal  limits  SARS CORONAVIRUS 2 BY RT PCR  HCG, SERUM, QUALITATIVE    EKG EKG Interpretation Date/Time:  Tuesday October 04 2022 09:15:21 EDT Ventricular Rate:  72 PR Interval:  146 QRS Duration:  72 QT Interval:  391 QTC Calculation: 428 R Axis:   30  Text Interpretation: Sinus rhythm Low voltage, precordial leads No significant change since last tracing Confirmed by Melene Plan [phone]) on 10/04/2022 9:28:17 AM  Radiology CT Head Wo Contrast  Result Date: 10/04/2022 CLINICAL DATA:  Provided history: Headache, neuro deficit. Headache, increasing frequency or severity. EXAM: CT HEAD WITHOUT CONTRAST TECHNIQUE: Contiguous axial images were obtained from the base of the skull through the vertex without intravenous contrast. RADIATION DOSE REDUCTION: This exam was performed according to the departmental dose-optimization program which includes automated exposure control, adjustment of the mA and/or kV according to patient size and/or use of iterative reconstruction technique. COMPARISON:  Head CT 08/05/2022. FINDINGS: Brain: Cerebral volume is normal. Partially empty sella turcica There is no acute intracranial hemorrhage. No demarcated cortical infarct. No extra-axial fluid collection. No evidence of an intracranial mass. No midline shift. Vascular: No hyperdense vessel. Skull: No calvarial fracture or aggressive osseous lesion. Sinuses/Orbits: No mass or acute finding within the imaged orbits. No significant paranasal sinus disease. IMPRESSION: 1. No evidence of an acute intracranial abnormality. 2. Partially empty sella turcica. This finding can reflect incidental anatomic variation, or alternatively, it can be associated with idiopathic intracranial hypertension (pseudotumor cerebri).  Electronically Signed   By: Jackey Loge D.O.   On: 10/04/2022 11:04    Procedures Procedures    Medications Ordered in ED Medications  ketorolac (TORADOL) 15 MG/ML injection 15 mg (0 mg Intravenous Hold 10/04/22 1001)  metoCLOPramide (REGLAN) injection 10 mg (0 mg Intravenous Hold 10/04/22 1001)  dexamethasone (DECADRON) injection 10 mg (0 mg Intravenous Hold 10/04/22 0959)  diphenhydrAMINE (BENADRYL) injection 25 mg (0 mg Intravenous Hold 10/04/22 1001)  sodium chloride 0.9 % bolus 1,000 mL (1,000 mLs Intravenous New Bag/Given 10/04/22 1025)    ED Course/ Medical Decision Making/ A&P                             Medical Decision Making Amount and/or Complexity of Data Reviewed Labs: ordered. Decision-making details documented in ED Course. Radiology: ordered. Decision-making details documented in ED Course. ECG/medicine tests: ordered. Decision-making details documented in ED Course.  Risk Prescription drug management.   Medical Decision Making:   NYCOLE KAWAHARA is a 47 y.o. female who presented to the ED today with headache detailed above.    Patient's presentation is complicated by their history of age, hypertension.  Complete initial physical exam performed, notably the patient was in no acute distress but tearful and complaining of severe pain.  No focal deficits identified on neuroexam.  Patient generally weak.    Reviewed and confirmed nursing documentation for past medical history, family history, social history.    Initial Assessment:   With the patient's presentation, emergent considerations for headache include subarachnoid hemorrhage, meningitis, temporal arteritis, glaucoma, cerebral ischemia, carotid/vertebral dissection, intracranial tumor, Venous sinus thrombosis, carbon monoxide poisoning, acute or chronic subdural hemorrhage.  Other considerations include: Migraine, Cluster headache, Hypertension, Caffeine, alcohol, or drug withdrawal, Pseudotumor cerebri,  Arteriovenous malformation, Head injury, Neurocysticercosis, Post-lumbar puncture, Preeclampsia, Tension headache, Sinusitis, Cervical arthritis, Refractive error causing strain, Dental abscess, Otitis media, Temporomandibular joint syndrome, Depression, Somatoform disorder (eg, somatization) Trigeminal neuralgia, Glossopharyngeal neuralgia.  This is most consistent  with an acute complicated illness  Initial Plan:  Screening labs including CBC and Metabolic panel to evaluate for infectious or metabolic etiology of disease.  Urinalysis with reflex culture ordered to evaluate for UTI or relevant urologic/nephrologic pathology.  EKG to evaluate for cardiac pathology COVID swab to assess for viral etiology Medications to treat headache Objective evaluation as below reviewed   Initial Study Results:   Laboratory  All laboratory results reviewed without evidence of clinically relevant pathology.   Exceptions include: Creatinine 1.04 improved from baseline, hemoglobin 11.5 similar to baseline  EKG EKG was reviewed independently. Rate, rhythm, axis, intervals all examined and without medically relevant abnormality. ST segments without concerns for elevations.    Radiology:  All images reviewed independently. Agree with radiology report at this time.   CT Head Wo Contrast  Result Date: 10/04/2022 CLINICAL DATA:  Provided history: Headache, neuro deficit. Headache, increasing frequency or severity. EXAM: CT HEAD WITHOUT CONTRAST TECHNIQUE: Contiguous axial images were obtained from the base of the skull through the vertex without intravenous contrast. RADIATION DOSE REDUCTION: This exam was performed according to the departmental dose-optimization program which includes automated exposure control, adjustment of the mA and/or kV according to patient size and/or use of iterative reconstruction technique. COMPARISON:  Head CT 08/05/2022. FINDINGS: Brain: Cerebral volume is normal. Partially empty sella  turcica There is no acute intracranial hemorrhage. No demarcated cortical infarct. No extra-axial fluid collection. No evidence of an intracranial mass. No midline shift. Vascular: No hyperdense vessel. Skull: No calvarial fracture or aggressive osseous lesion. Sinuses/Orbits: No mass or acute finding within the imaged orbits. No significant paranasal sinus disease. IMPRESSION: 1. No evidence of an acute intracranial abnormality. 2. Partially empty sella turcica. This finding can reflect incidental anatomic variation, or alternatively, it can be associated with idiopathic intracranial hypertension (pseudotumor cerebri). Electronically Signed   By: Jackey Loge D.O.   On: 10/04/2022 11:04      Final Assessment and Plan:   47 year old female presents to the ED complaining of severe headache as well as lightheadedness and paresthesias to her cheeks.  She does have a history of idiopathic intracranial hypertension currently managed with Diamox at home.  On exam, she has no focal deficits.  She is hypertensive but otherwise vital signs are unremarkable.  Initially, patient agreeable to receive headache cocktail in the ED but then decided to wait on workup before proceeding with headache treatment as she does not like to take medications if necessary.  No significant findings on labs or CT brain today.  Repeated imaging as patient was complaining of new paresthesias she has not experienced in the past.  She does also complain of some blurred vision but this is not new and has been ongoing for about 2 months now.  No papilledema on funduscopic exam.  Recent normal eye exam outpatient.  Discussed all reassuring findings with patient.  Discussion regarding medications used in the ED for headache treatment and she declines in favor of treating headache at home with medication she already has.  She notes that she has a follow-up appointment with neurology in 2 weeks.  Discussed with patient that I highly recommend she  follow-up at this appointment or earlier if possible to discuss better management of her chronic headaches.  Patient agreeable to do so.  Discussed strict ED return precautions, all questions answered, and stable for discharge.   Clinical Impression:  1. Other headache syndrome   2. Paresthesias   3. Idiopathic intracranial hypertension  Discharge           Final Clinical Impression(s) / ED Diagnoses Final diagnoses:  Other headache syndrome  Paresthesias  Idiopathic intracranial hypertension    Rx / DC Orders ED Discharge Orders     None         Tonette Lederer, PA-C 10/04/22 1124    Melene Plan, DO 10/04/22 1143

## 2022-10-11 ENCOUNTER — Ambulatory Visit (INDEPENDENT_AMBULATORY_CARE_PROVIDER_SITE_OTHER): Payer: No Typology Code available for payment source | Admitting: Neurology

## 2022-10-11 ENCOUNTER — Encounter: Payer: Self-pay | Admitting: Neurology

## 2022-10-11 VITALS — BP 149/85 | HR 79 | Ht 69.0 in | Wt 226.0 lb

## 2022-10-11 DIAGNOSIS — G932 Benign intracranial hypertension: Secondary | ICD-10-CM

## 2022-10-11 NOTE — Patient Instructions (Signed)
Check lumbar puncture   Continue diamox 250mg  twice daily

## 2022-10-11 NOTE — Progress Notes (Signed)
Follow-up Visit   Date: 10/11/2022    RONNETTE Schwartz MRN: [id number] DOB: [birth year]    Julie Schwartz is a 47 y.o. right-handed female with hypertension, right CTS release, pseudotumor cerebri (2014), pheochromocytoma, and fibromyalgia returning to the clinic for follow-up of headaches.  The patient was accompanied to the clinic by self.    IMPRESSION/PLAN: Chronic daily headaches with history of idiopathic intracranial hypertension.  There is no papilledema on exam.  She is on diamox 250mg  BID, and there is room to increase the dose, however she is reporting paresthesias.   I recommend proceeding with high volume LP with opening and closing pressure to determine whether there is evidence of intracranial hypertension to help guide management.  In the meantime, continue diamox 250mg  twice daily.    --------------------------------------------- History of present illness: Starting around April 2024, she began having pain involving the bifrontal and base of the neck, as if she was wearing a visor.  Pain is described as dull, pressure, tight pain. It can last anywhere from several hours to all days.  Headaches occur almost daily for the past month.  She endorses photophobia, phonophobia, nausea.  She reports having headaches in the past which were holocephalic.  She has tried tylenol, NSAIDs which does not provide relief.     She has blurred vision and had eye exam which was normal three weeks ago. Specifically, no papilledema.  Because of light sensitivity, she was recommended rose tine eye glasses.  Of note, she has history of idiopathic intracranial hypertension which was diagnosed by LP in 2014 where opening pressure was 26.5.   UPDATE 10/11/2022:  She continues to have daily headaches, which are unchanged.  Pain remains bifrontal and at the base of the neck, described as pressure.  She is compliant with diamox 250mg  BID and feels that it helps some, but she is getting tingling in  the face.  She went to the ER on 7/16 for headaches and was offered headache cocktail, but did not want to get any medications. CT head was stable and continued to show partially empty sella. No new findings.  No new complaints.    Medications:  Current Outpatient Medications on File Prior to Visit  Medication Sig Dispense Refill   acetaZOLAMIDE (DIAMOX) 250 MG tablet Take 1 tablet (250 mg total) by mouth 2 (two) times daily. 60 tablet 3   amitriptyline (ELAVIL) 25 MG tablet TK 2 TS PO HS  5   amLODipine (NORVASC) 10 MG tablet Take 1 tablet by mouth in the morning and at bedtime.     doxazosin (CARDURA) 1 MG tablet Take 1 tablet by mouth 2 (two) times daily.     ibuprofen (ADVIL,MOTRIN) 800 MG tablet Take 1 tablet (800 mg total) by mouth 3 (three) times daily. 30 tablet 0   lisinopril (ZESTRIL) 10 MG tablet Take 1 tablet by mouth daily.     propranolol ER (INDERAL LA) 120 MG 24 hr capsule Take 160 mg by mouth daily.     No current facility-administered medications on file prior to visit.    Allergies:  Allergies  Allergen Reactions   Lyrica [Pregabalin] Other (See Comments)    drowsy    Vital Signs:  BP (!) 149/85   Pulse 79   Ht 5\' 9"  (1.753 m)   Wt 226 lb (102.5 kg)   SpO2 100%   BMI 33.37 kg/m   Neurological Exam: MENTAL STATUS including orientation to time, place, person, recent and remote memory,  attention span and concentration, language, and fund of knowledge is normal.  Speech is not dysarthric.  CRANIAL NERVES:  No papilledema on fundoscopic exam.  Pupils equal round and reactive to light.  Normal conjugate, extra-ocular eye movements in all directions of gaze.  No ptosis.  Face is symmetric.   MOTOR:  Motor strength is 5/5 in all extremities.    MSRs:  Reflexes are 2+/4 throughout.  COORDINATION/GAIT:  Gait narrow based and stable.   Data: CT head wo contrast 10/04/2022: 1. No evidence of an acute intracranial abnormality. 2. Partially empty sella turcica. This  finding can reflect incidental anatomic variation, or alternatively, it can be associated with idiopathic intracranial hypertension (pseudotumor cerebri).    Thank you for allowing me to participate in patient's care.  If I can answer any additional questions, I would be pleased to do so.    Sincerely,    Mittie Knittel K. Allena Katz, DO

## 2022-10-13 ENCOUNTER — Encounter: Payer: Self-pay | Admitting: Neurology

## 2022-10-19 ENCOUNTER — Other Ambulatory Visit: Payer: Self-pay | Admitting: Internal Medicine

## 2022-10-19 DIAGNOSIS — Z1231 Encounter for screening mammogram for malignant neoplasm of breast: Secondary | ICD-10-CM

## 2022-10-24 ENCOUNTER — Inpatient Hospital Stay: Admission: RE | Admit: 2022-10-24 | Payer: No Typology Code available for payment source | Source: Ambulatory Visit

## 2022-10-25 ENCOUNTER — Ambulatory Visit
Admission: RE | Admit: 2022-10-25 | Discharge: 2022-10-25 | Disposition: A | Discharge status: Home / Self Care | Payer: Medicaid Other | Source: Ambulatory Visit | Attending: Internal Medicine | Admitting: Internal Medicine

## 2022-10-25 DIAGNOSIS — Z1231 Encounter for screening mammogram for malignant neoplasm of breast: Secondary | ICD-10-CM

## 2022-10-27 NOTE — Discharge Instructions (Signed)

## 2022-10-28 ENCOUNTER — Ambulatory Visit
Admission: RE | Admit: 2022-10-28 | Discharge: 2022-10-28 | Disposition: A | Discharge status: Home / Self Care | Payer: No Typology Code available for payment source | Source: Ambulatory Visit | Attending: Neurology | Admitting: Neurology

## 2022-10-28 DIAGNOSIS — G932 Benign intracranial hypertension: Secondary | ICD-10-CM

## 2022-10-28 HISTORY — PX: LUMBAR PUNCTURE: SHX1985

## 2022-10-28 LAB — CSF CELL COUNT WITH DIFFERENTIAL
RBC Count, CSF: 0 cells/uL
TOTAL NUCLEATED CELL: 1 cells/uL (ref 0–5)

## 2022-10-28 LAB — GLUCOSE, CSF: Glucose, CSF: 70 mg/dL (ref 40–80)

## 2022-10-28 LAB — PROTEIN, CSF: Total Protein, CSF: 36 mg/dL (ref 15–45)

## 2022-11-28 ENCOUNTER — Encounter: Payer: Self-pay | Admitting: Neurology

## 2022-11-28 ENCOUNTER — Ambulatory Visit (INDEPENDENT_AMBULATORY_CARE_PROVIDER_SITE_OTHER): Payer: No Typology Code available for payment source | Admitting: Neurology

## 2022-11-28 VITALS — BP 156/94 | HR 65 | Ht 69.0 in | Wt 230.0 lb

## 2022-11-28 DIAGNOSIS — R519 Headache, unspecified: Secondary | ICD-10-CM

## 2022-11-28 DIAGNOSIS — G932 Benign intracranial hypertension: Secondary | ICD-10-CM | POA: Diagnosis not present

## 2022-11-28 DIAGNOSIS — M542 Cervicalgia: Secondary | ICD-10-CM

## 2022-11-28 NOTE — Progress Notes (Signed)
Follow-up Visit   Date: 11/28/2022    Julie Schwartz MRN: [id number] DOB: November 12, 1975    Julie Schwartz is a 47 y.o. right-handed female with hypertension, right CTS release, pseudotumor cerebri (2014), pheochromocytoma, and fibromyalgia returning to the clinic for follow-up of headaches.  The patient was accompanied to the clinic by self.    IMPRESSION/PLAN: Chronic daily headaches, most likely associated with elevated blood pressure - She is on multiple medications for blood pressure including propranolol 160mg  daily, lisinopril 10mg  daily, amlodipine 10mg  and doxazosin 1mg  BID - Discussed adding preventative agent such as topiramate, but mutually decided not to add another medication  - Start neck physical therapy  2.   History of idiopathic intracranial hypertension (IIH) with LP on 10/28/2022 showing normal opening pressure (18 cm H2O).  She has been on diamox 250mg  BID, but given that her pressure is normal, current headaches are NOT due to IIH.  Therefore, I suggest that she stay on diamox 250mg  BID.    Return to clinic in 4 months  --------------------------------------------- History of present illness: Starting around April 2024, she began having pain involving the bifrontal and base of the neck, as if she was wearing a visor.  Pain is described as dull, pressure, tight pain. It can last anywhere from several hours to all days.  Headaches occur almost daily for the past month.  She endorses photophobia, phonophobia, nausea.  She reports having headaches in the past which were holocephalic.  She has tried tylenol, NSAIDs which does not provide relief.     She has blurred vision and had eye exam which was normal three weeks ago. Specifically, no papilledema.  Because of light sensitivity, she was recommended rose tine eye glasses.  Of note, she has history of idiopathic intracranial hypertension which was diagnosed by LP in 2014 where opening pressure was 26.5.    UPDATE 10/11/2022:  She continues to have daily headaches, which are unchanged.  Pain remains bifrontal and at the base of the neck, described as pressure.  She is compliant with diamox 250mg  BID and feels that it helps some, but she is getting tingling in the face.  She went to the ER on 7/16 for headaches and was offered headache cocktail, but did not want to get any medications. CT head was stable and continued to show partially empty sella. No new findings.  No new complaints.     UPDATE 11/28/2022:  Due to ongoing headaches and concern that all of her headache was not coming from pseudotumor cerebri, she underwent LP on 10/28/2022 which showed OP of 18cm (normal).  She reports having ongoing headaches and neck pain.  Her BP is staying elevated and she can tell that this makes her headaches worse.    Medications:  Current Outpatient Medications on File Prior to Visit  Medication Sig Dispense Refill   acetaZOLAMIDE (DIAMOX) 250 MG tablet Take 1 tablet (250 mg total) by mouth 2 (two) times daily. 60 tablet 3   amitriptyline (ELAVIL) 25 MG tablet TK 2 TS PO HS  5   amLODipine (NORVASC) 10 MG tablet Take 1 tablet by mouth in the morning and at bedtime.     doxazosin (CARDURA) 1 MG tablet Take 1 tablet by mouth 2 (two) times daily.     ibuprofen (ADVIL,MOTRIN) 800 MG tablet Take 1 tablet (800 mg total) by mouth 3 (three) times daily. 30 tablet 0   lisinopril (ZESTRIL) 10 MG tablet Take 1 tablet by mouth daily.  propranolol ER (INDERAL LA) 120 MG 24 hr capsule Take 160 mg by mouth daily.     No current facility-administered medications on file prior to visit.    Allergies:  Allergies  Allergen Reactions   Lyrica [Pregabalin] Other (See Comments)    drowsy    Vital Signs:  BP (!) 165/88   Pulse 65   Ht 5\' 9"  (1.753 m)   Wt 230 lb (104.3 kg)   SpO2 98%   BMI 33.97 kg/m   Neurological Exam: MENTAL STATUS including orientation to time, place, person, recent and remote memory, attention  span and concentration, language, and fund of knowledge is normal.  Speech is not dysarthric.  CRANIAL NERVES:  Pupils equal round and reactive to light.  Normal conjugate, extra-ocular eye movements in all directions of gaze.  No ptosis.  Face is symmetric.   MOTOR:  Motor strength is 5/5 in all extremities.    MSRs:  Reflexes are 2+/4 throughout.  COORDINATION/GAIT:  Gait narrow based and stable.   Data: CT head wo contrast 10/04/2022: 1. No evidence of an acute intracranial abnormality. 2. Partially empty sella turcica. This finding can reflect incidental anatomic variation, or alternatively, it can be associated with idiopathic intracranial hypertension (pseudotumor cerebri).  LP 10/28/2022:  OP 18cm H2O  Thank you for allowing me to participate in patient's care.  If I can answer any additional questions, I would be pleased to do so.    Sincerely,    Maxum Cassarino K. Allena Katz, DO

## 2022-11-28 NOTE — Patient Instructions (Signed)
We will refer you for neck PT  I will see you back in 4 months

## 2022-12-13 NOTE — H&P (Signed)
Julie Schwartz is an 47 y.o. [id number] who is admitted for ***.  Patient Active Problem List   Diagnosis Date Noted   Myalgia and myositis, unspecified 11/28/2012   Headache(784.0) 11/28/2012   Disturbance of skin sensation 11/28/2012    Pertinent Gynecological History: Menses: {menses:16152} Bleeding: {uterine bleeding:32112} Contraception: {contraception:5051} Sexually transmitted diseases: {std risk:32110} Previous GYN Procedures: {previous procedures:3041388}  Last mammogram: {normal/abnormal***:32111} Date: *** Last pap: {normal/abnormal***:32111} Date: *** OB History: G2P2002   MEDICAL/FAMILY/SOCIAL HX: No LMP recorded. (Menstrual status: Other).    Past Medical History:  Diagnosis Date   Anemia    Depression    Fibromyalgia    Headache(784.0) 11/28/2012   Hypertension    since the headaches   Iron deficiency anemia    Migraine    Myalgia    Myalgia and myositis, unspecified 11/28/2012   Obesity    Panic attack     Past Surgical History:  Procedure Laterality Date   CARPAL TUNNEL RELEASE Right    CESAREAN SECTION     TUBAL LIGATION      Family History  Problem Relation Age of Onset   Cancer - Lung Mother    Depression Father    Lupus Paternal Aunt    Breast cancer Maternal Grandmother    Arthritis/Rheumatoid Maternal Grandmother    Other Maternal Grandmother        Kidney Issues   Other Maternal Grandfather    Hypertension Maternal Grandfather     Social History:  reports that she has never smoked. She has never used smokeless tobacco. She reports that she does not currently use alcohol. She reports that she does not use drugs.  ALLERGIES/MEDS:  Allergies:  Allergies  Allergen Reactions   Lyrica [Pregabalin] Other (See Comments)    drowsy    No medications prior to admission.     ROS  There were no vitals taken for this visit. Physical Exam  No results found for this or any previous visit (from the past 24 hour(s)).  No results  found.   ASSESSMENT/PLAN: TIFFONY CLARIZIO is a 47 y.o. [id number] who is admitted for ***   Steva Ready, DO

## 2022-12-14 ENCOUNTER — Ambulatory Visit: Payer: No Typology Code available for payment source | Attending: Neurology

## 2022-12-14 DIAGNOSIS — N939 Abnormal uterine and vaginal bleeding, unspecified: Secondary | ICD-10-CM

## 2022-12-14 DIAGNOSIS — R102 Pelvic and perineal pain: Secondary | ICD-10-CM | POA: Insufficient documentation

## 2022-12-16 ENCOUNTER — Encounter (HOSPITAL_BASED_OUTPATIENT_CLINIC_OR_DEPARTMENT_OTHER): Payer: Self-pay | Admitting: Obstetrics and Gynecology

## 2022-12-16 ENCOUNTER — Other Ambulatory Visit: Payer: Self-pay

## 2022-12-16 NOTE — Progress Notes (Signed)
Anesthesia Evaluation: Given this patients questionable history of poorly controlled HTN and ?pheochromocytoma (which appears it is non-secretory), I believe this patient is not a candidate for outpatient surgery center. She should be done at [facility] main where arterial placement is routine and intravenous antihypertensive drugs are more readily available. She should also be evaluated at the PAT clinic by a PA to ensure we have all of the needed documentation to ensure Julie Schwartz's safety.

## 2022-12-16 NOTE — Progress Notes (Signed)
The patient has a hx of pheochromocytoma of the left adrenal gland, idiopathic intracranial hypertension, empty sella, severe headaches and hypertension. I reviewed this case with Dr. Renold Don, MDA. Per Dr. Renold Don, this patient needs to be moved to the main OR.

## 2022-12-26 ENCOUNTER — Other Ambulatory Visit (HOSPITAL_COMMUNITY): Payer: No Typology Code available for payment source

## 2022-12-28 DIAGNOSIS — Z01818 Encounter for other preprocedural examination: Secondary | ICD-10-CM

## 2022-12-28 DIAGNOSIS — R102 Pelvic and perineal pain: Secondary | ICD-10-CM

## 2022-12-28 DIAGNOSIS — N939 Abnormal uterine and vaginal bleeding, unspecified: Secondary | ICD-10-CM

## 2023-02-01 NOTE — Progress Notes (Signed)
Surgical Instructions   Your procedure is scheduled on Wednesday February 08, 2023. Report to [facility] Main Entrance "A" at 9:35 A.M., then check in with the Admitting office. Any questions or running late day of surgery: call [phone]  Questions prior to your surgery date: call [phone], Monday-Friday, 8am-4pm. If you experience any cold or flu symptoms such as cough, fever, chills, shortness of breath, etc. between now and your scheduled surgery, please notify us at the above number.     Remember:  Do not eat after midnight the night before your surgery   You may drink clear liquids until 8:35 the morning of your surgery.   Clear liquids allowed are: Water, Non-Citrus Juices (without pulp), Carbonated Beverages, Clear Tea (no milk, honey, etc.), Black Coffee Only (NO MILK, CREAM OR POWDERED CREAMER of any kind), and Gatorade.  Patient Instructions  The night before surgery:  No food after midnight. ONLY clear liquids after midnight  The day of surgery (if you do NOT have diabetes):  Drink ONE (1) Pre-Surgery Clear Ensure by 8:35 the morning of surgery. Drink in one sitting. Do not sip.  This drink was given to you during your hospital  pre-op appointment visit.  Nothing else to drink after completing the  Pre-Surgery Clear Ensure.         If you have questions, please contact your surgeon's office.    Take these medicines the morning of surgery with A SIP OF WATER  amLODipine (NORVASC)  doxazosin (CARDURA)   One week prior to surgery, STOP taking any Aspirin (unless otherwise instructed by your surgeon) Aleve, Naproxen, Ibuprofen, Motrin, Advil, Goody's, BC's, all herbal medications, fish oil, and non-prescription vitamins.                     Do NOT Smoke (Tobacco/Vaping) for 24 hours prior to your procedure.  If you use a CPAP at night, you may bring your mask/headgear for your overnight stay.   You will be asked to remove any contacts, glasses, piercing's,  hearing aid's, dentures/partials prior to surgery. Please bring cases for these items if needed.    Patients discharged the day of surgery will not be allowed to drive home, and someone needs to stay with them for 24 hours.  SURGICAL WAITING ROOM VISITATION Patients may have no more than 2 support people in the waiting area - these visitors may rotate.   Pre-op nurse will coordinate an appropriate time for 1 ADULT support person, who may not rotate, to accompany patient in pre-op.  Children under the age of 74 must have an adult with them who is not the patient and must remain in the main waiting area with an adult.  If the patient needs to stay at the hospital during part of their recovery, the visitor guidelines for inpatient rooms apply.  Please refer to the [facility] website for the visitor guidelines for any additional information.   If you received a COVID test during your pre-op visit  it is requested that you wear a mask when out in public, stay away from anyone that may not be feeling well and notify your surgeon if you develop symptoms. If you have been in contact with anyone that has tested positive in the last 10 days please notify you surgeon.      Pre-operative CHG Bathing Instructions   You can play a key role in reducing the risk of infection after surgery. Your skin needs to be as free of  germs as possible. You can reduce the number of germs on your skin by washing with CHG (chlorhexidine gluconate) soap before surgery. CHG is an antiseptic soap that kills germs and continues to kill germs even after washing.   DO NOT use if you have an allergy to chlorhexidine/CHG or antibacterial soaps. If your skin becomes reddened or irritated, stop using the CHG and notify one of our RNs at [phone].              TAKE A SHOWER THE NIGHT BEFORE SURGERY AND THE DAY OF SURGERY    Please keep in mind the following:  DO NOT shave, including legs and underarms, 48 hours prior to  surgery.   You may shave your face before/day of surgery.  Place clean sheets on your bed the night before surgery Use a clean washcloth (not used since being washed) for each shower. DO NOT sleep with pet's night before surgery.  CHG Shower Instructions:  Wash your face and private area with normal soap. If you choose to wash your hair, wash first with your normal shampoo.  After you use shampoo/soap, rinse your hair and body thoroughly to remove shampoo/soap residue.  Turn the water OFF and apply half the bottle of CHG soap to a CLEAN washcloth.  Apply CHG soap ONLY FROM YOUR NECK DOWN TO YOUR TOES (washing for 3-5 minutes)  DO NOT use CHG soap on face, private areas, open wounds, or sores.  Pay special attention to the area where your surgery is being performed.  If you are having back surgery, having someone wash your back for you may be helpful. Wait 2 minutes after CHG soap is applied, then you may rinse off the CHG soap.  Pat dry with a clean towel  Put on clean pajamas    Additional instructions for the day of surgery: DO NOT APPLY any lotions, deodorants or perfumes.   Do not wear jewelry or makeup Do not wear nail polish, gel polish, artificial nails, or any other type of covering on natural nails (fingers and toes) Do not bring valuables to the hospital. [facility] is not responsible for valuables/personal belongings. Put on clean/comfortable clothes.  Please brush your teeth.  Ask your nurse before applying any prescription medications to the skin.

## 2023-02-02 ENCOUNTER — Other Ambulatory Visit: Payer: Self-pay

## 2023-02-02 ENCOUNTER — Encounter (HOSPITAL_COMMUNITY)
Admission: RE | Admit: 2023-02-02 | Discharge: 2023-02-02 | Disposition: A | Discharge status: Home / Self Care | Payer: No Typology Code available for payment source | Source: Ambulatory Visit | Attending: Obstetrics and Gynecology | Admitting: Obstetrics and Gynecology

## 2023-02-02 ENCOUNTER — Encounter (HOSPITAL_COMMUNITY): Payer: Self-pay

## 2023-02-02 VITALS — BP 159/74 | HR 65 | Temp 98.4°F | Resp 18 | Ht 69.0 in | Wt 235.1 lb

## 2023-02-02 DIAGNOSIS — N939 Abnormal uterine and vaginal bleeding, unspecified: Secondary | ICD-10-CM | POA: Insufficient documentation

## 2023-02-02 DIAGNOSIS — R102 Pelvic and perineal pain: Secondary | ICD-10-CM | POA: Diagnosis not present

## 2023-02-02 DIAGNOSIS — Z01818 Encounter for other preprocedural examination: Secondary | ICD-10-CM | POA: Diagnosis present

## 2023-02-02 DIAGNOSIS — G932 Benign intracranial hypertension: Secondary | ICD-10-CM | POA: Diagnosis not present

## 2023-02-02 DIAGNOSIS — Z79899 Other long term (current) drug therapy: Secondary | ICD-10-CM | POA: Diagnosis not present

## 2023-02-02 DIAGNOSIS — Z01812 Encounter for preprocedural laboratory examination: Secondary | ICD-10-CM | POA: Insufficient documentation

## 2023-02-02 HISTORY — DX: Other specified disorders of adrenal gland: E27.8

## 2023-02-02 LAB — BASIC METABOLIC PANEL
Anion gap: 8 (ref 5–15)
BUN: 14 mg/dL (ref 6–20)
CO2: 26 mmol/L (ref 22–32)
Calcium: 9.1 mg/dL (ref 8.9–10.3)
Chloride: 107 mmol/L (ref 98–111)
Creatinine, Ser: 1.03 mg/dL — ABNORMAL HIGH (ref 0.44–1.00)
GFR, Estimated: >60 mL/min (ref >60)
Glucose, Bld: 103 mg/dL — ABNORMAL HIGH (ref 70–99)
Potassium: 3.9 mmol/L (ref 3.5–5.1)
Sodium: 141 mmol/L (ref 135–145)

## 2023-02-02 LAB — CBC
HCT: 36.1 % (ref 36.0–46.0)
Hemoglobin: 11.4 g/dL — ABNORMAL LOW (ref 12.0–15.0)
MCH: 30.9 pg (ref 26.0–34.0)
MCHC: 31.6 g/dL (ref 30.0–36.0)
MCV: 97.8 fL (ref 80.0–100.0)
Platelets: 262 10*3/uL (ref 150–400)
RBC: 3.69 MIL/uL — ABNORMAL LOW (ref 3.87–5.11)
RDW: 12.3 % (ref 11.5–15.5)
WBC: 4.9 10*3/uL (ref 4.0–10.5)
nRBC: 0 % (ref 0.0–0.2)

## 2023-02-02 LAB — TYPE AND SCREEN
ABO/RH(D): A POS
Antibody Screen: NEGATIVE

## 2023-02-02 NOTE — Progress Notes (Signed)
PCP - Georgann Housekeeper neurologist- Patel  PPM/ICD - denies   Chest x-ray - n/a EKG - 10/05/22 Stress Test - denies ECHO - denies Cardiac Cath - denies  Sleep Study - denies  7 days prior to surgery STOP taking any Aspirin (unless otherwise instructed by your surgeon), Aleve, Naproxen, Ibuprofen, Motrin, Advil, Goody's, BC's, all herbal medications, fish oil, and all vitamins.   ERAS Protcol -yes PRE-SURGERY Ensure or G2- ensure ordered and given  COVID TEST- not needed   Anesthesia review: yes, history of pheochromocytoma of the left adrenal gland, idiopathic intracranial hypertension   Patient denies shortness of breath, fever, cough and chest pain at PAT appointment   All instructions explained to the patient, with a verbal understanding of the material. Patient agrees to go over the instructions while at home for a better understanding. Patient also instructed to self quarantine after being tested for COVID-19. The opportunity to ask questions was provided.

## 2023-02-03 ENCOUNTER — Encounter (HOSPITAL_COMMUNITY): Payer: Self-pay

## 2023-02-03 NOTE — Progress Notes (Signed)
Anesthesia Chart Review:  Case: [id number] Date/Time: 02/08/23 1120   Procedure: XI ROBOTIC ASSISTED TOTAL LAPAROSCOPIC HYSTERECTOMY AND SALPINGECTOMY (Bilateral)   Anesthesia type: Choice   Pre-op diagnosis:      Abnormal uterine bleeding      Pelvic pain   Location: [facility] ROOM 10 / [facility] OR   Surgeons: Steva Ready, DO       DISCUSSION: Patient is a 47 year old female scheduled for the above procedure--she will be 47 years old on her surgery date. Cased from moved [facility] due to left adrenal nodule and concern for pheochromocytoma. Since then documentation found that plasma and urine metanephrines were negative for pheochromocytoma.  History includes never smoker, HTN, idiopathic intracranial hypertension, left adrenal nodule (reported labs negative for   pheochromocytoma ~ July 2024), anemia, fibromyalgia, migraines, panic attacks, obesity.  Per [facility] general surgeon Dr. Val Eagle on 09/23/22, "After obtaining her plasma metanephrines as well as 24-hour urine metanephrines, these are both negative for pheochromocytoma. I discussed with Dr. Sharl Ma, her endocrinologist that without objective evidence of pheochromocytoma, I would not push for surgery at this time. I discussed this with the patient as well. We will cancel her scheduled procedure at this time. If further laboratory testing does confirm pheochromocytoma, we can certainly pursue adrenalectomy at that time."  She is followed by neurologist Dr. Allena Katz for chronic headaches and idiopathic intracranial hypertension (IIH) diagnosed in 2014 with opening pressure of 26.5. LP on 10/28/22 showed a normal opening pressure of 18 cm H2O. She is on diamox 250 mg BID, so given normal pressure her current headaches were not felt due to IIH. Chronic headaches likely exacerbating her BP. Current BP medications include amlodipine 10 mg  BID, doxazosin 1 mg BID, and lisinopril 10 mg daily, Inderal LA 160 mg Q HS. Could consider adding topiramate for headaches  in the future. BP 159/74. HR 65.   I have requested her last PCP and endocrinologist notes and labs from Dr. Sharl Ma. In the interim, discussed with anesthesiologist Lewie Loron, MD that patient's labs earlier this year were reportedly not consistent with active pheochromocytoma.    VS: BP (!) 159/74   Pulse 65   Temp 36.9 C (Oral)   Resp 18   Ht 5\' 9"  (1.753 m)   Wt 106.6 kg   SpO2 100%   BMI 34.72 kg/m    PROVIDERS: Georgann Housekeeper, MD is PCP  Talmage Coin, MD is endocrinologist Nita Sickle, DO is neurologist Val Eagle, MD is endocrine general surgeon    LABS: Labs reviewed: Acceptable for surgery. She is for pregnancy test on the day of surgery.  (all labs ordered are listed, but only abnormal results are displayed)  Labs Reviewed  CBC - Abnormal; Notable for the following components:      Result Value   RBC 3.69 (*)    Hemoglobin 11.4 (*)    All other components within normal limits  BASIC METABOLIC PANEL - Abnormal; Notable for the following components:   Glucose, Bld 103 (*)    Creatinine, Ser 1.03 (*)    All other components within normal limits  TYPE AND SCREEN     IMAGES: CT Head 08/05/22: IMPRESSION: 1. No evidence of an acute intracranial abnormality. 2. Partially empty sella turcica. This finding can reflect incidental anatomic variation, or alternatively, it can be associated with idiopathic intracranial hypertension (pseudotumor cerebri).  CT Abd/pelvis 07/01/22: MPRESSION: 1. 15 x 12 mm left adrenal nodule with enhancement characteristics which are compatible with an  adrenal adenoma. However, the marked enhancement also raises the possibility of a pheochromocytoma with a substantial minority of pheochromocytomas (30-45%), having washout characteristics which overlap with adrenal adenomas, as such suggest further evaluation by patient's signs/symptoms and serum/urine biochemical profile is suggested. 2. Sigmoid colonic diverticulosis  without findings of acute diverticulitis.   EKG: 10/05/22: Sinus rhythm Low voltage, precordial leads No significant change since last tracing Confirmed by Melene Plan [phone]) on 10/04/2022 9:28:17 AM   CV: N/A  Past Medical History:  Diagnosis Date   Anemia    no hx of iron infusions or blood transfusions   Depression    when mother and grandmother passed away a few years ago only   Fibromyalgia    Follows w/ Dr. Donette Larry.   Headache(784.0) 11/28/2012   frequent headaches, 3 - 4 times per week   Hypertension    Follows w/ Dr. Eula Listen @ [place].   Idiopathic intracranial hypertension    Patient has an empty sella.Follows w/ Dr. Nita Sickle, Neurology.   Iron deficiency anemia    Left adrenal mass [facility])    Endocrinologist Dr. Talmage Coin; per 09/23/22 note by Dr. Val Eagle, plasma and urine metaphrines were negative for pheochromocytoma   Migraine    w/ HTN   Obesity    Panic attack    per patient caused by pheochromocytoma    Past Surgical History:  Procedure Laterality Date   CARPAL TUNNEL RELEASE Right 2004   CESAREAN SECTION     2001 and 2006   LUMBAR PUNCTURE  10/28/2022   normal open pressure 18cm H2O   TUBAL LIGATION  2006    MEDICATIONS:  acetaZOLAMIDE (DIAMOX) 250 MG tablet   amitriptyline (ELAVIL) 25 MG tablet   amLODipine (NORVASC) 10 MG tablet   doxazosin (CARDURA) 1 MG tablet   ibuprofen (ADVIL,MOTRIN) 800 MG tablet   lisinopril (ZESTRIL) 10 MG tablet   propranolol ER (INDERAL LA) 160 MG SR capsule   No current facility-administered medications for this encounter.     Shonna Chock, PA-C Surgical Short Stay/Anesthesiology [facility] Phone [phone] [facility] Phone [phone] 02/03/2023 2:40 PM

## 2023-02-06 NOTE — Anesthesia Preprocedure Evaluation (Signed)
Anesthesia Evaluation  Patient identified by MRN, date of birth, ID band Patient awake    Reviewed: Allergy & Precautions, NPO status , Patient's Chart, lab work & pertinent test results  History of Anesthesia Complications Negative for: history of anesthetic complications  Airway Mallampati: II  TM Distance: >3 FB Neck ROM: Full    Dental  (+) Missing,    Pulmonary neg pulmonary ROS   Pulmonary exam normal        Cardiovascular hypertension, Pt. on medications Normal cardiovascular exam     Neuro/Psych  Headaches  Anxiety Depression    IIH    GI/Hepatic negative GI ROS, Neg liver ROS,,,  Endo/Other  negative endocrine ROS    Renal/GU negative Renal ROS     Musculoskeletal  (+)  Fibromyalgia -  Abdominal   Peds  Hematology  (+) Blood dyscrasia (Hgb 11.4), anemia   Anesthesia Other Findings Left adrenal mass, negative plasma/urine metanephrines  Reproductive/Obstetrics                              Anesthesia Physical Anesthesia Plan  ASA: 2  Anesthesia Plan: General   Post-op Pain Management: Tylenol PO (pre-op)* and Toradol IV (intra-op)*   Induction: Intravenous  PONV Risk Score and Plan: 3 and Treatment may vary due to age or medical condition, Ondansetron, Dexamethasone, Midazolam and Scopolamine patch - Pre-op  Airway Management Planned: Oral ETT  Additional Equipment: None  Intra-op Plan:   Post-operative Plan: Extubation in OR  Informed Consent: I have reviewed the patients History and Physical, chart, labs and discussed the procedure including the risks, benefits and alternatives for the proposed anesthesia with the patient or authorized representative who has indicated his/her understanding and acceptance.     Dental advisory given  Plan Discussed with: CRNA  Anesthesia Plan Comments: (PAT note written by Shonna Chock, PA-C.  Per [facility] general surgeon Dr.  Val Eagle on 09/23/22, "After obtaining her plasma metanephrines as well as 24-hour urine metanephrines, these are both negative for pheochromocytoma. I discussed with Dr. Sharl Ma, her endocrinologist that without objective evidence of pheochromocytoma, I would not push for surgery at this time. I discussed this with the patient as well. We will cancel her scheduled procedure at this time. If further laboratory testing does confirm pheochromocytoma, we can certainly pursue adrenalectomy at that time.")         Anesthesia Quick Evaluation

## 2023-02-08 ENCOUNTER — Ambulatory Visit (HOSPITAL_BASED_OUTPATIENT_CLINIC_OR_DEPARTMENT_OTHER): Payer: No Typology Code available for payment source | Admitting: Anesthesiology

## 2023-02-08 ENCOUNTER — Other Ambulatory Visit: Payer: Self-pay

## 2023-02-08 ENCOUNTER — Ambulatory Visit (HOSPITAL_COMMUNITY)
Admission: RE | Admit: 2023-02-08 | Discharge: 2023-02-09 | Disposition: A | Discharge status: Home / Self Care | Payer: No Typology Code available for payment source | Attending: Obstetrics and Gynecology | Admitting: Obstetrics and Gynecology

## 2023-02-08 ENCOUNTER — Ambulatory Visit (HOSPITAL_COMMUNITY): Payer: No Typology Code available for payment source | Admitting: Anesthesiology

## 2023-02-08 ENCOUNTER — Other Ambulatory Visit (HOSPITAL_COMMUNITY): Payer: Self-pay

## 2023-02-08 ENCOUNTER — Encounter (HOSPITAL_COMMUNITY): Payer: Self-pay | Admitting: Obstetrics and Gynecology

## 2023-02-08 ENCOUNTER — Encounter (HOSPITAL_COMMUNITY)
Admission: RE | Disposition: A | Discharge status: Home / Self Care | Payer: Self-pay | Source: Home / Self Care | Attending: Obstetrics and Gynecology

## 2023-02-08 DIAGNOSIS — D259 Leiomyoma of uterus, unspecified: Secondary | ICD-10-CM | POA: Insufficient documentation

## 2023-02-08 DIAGNOSIS — M797 Fibromyalgia: Secondary | ICD-10-CM | POA: Insufficient documentation

## 2023-02-08 DIAGNOSIS — N939 Abnormal uterine and vaginal bleeding, unspecified: Secondary | ICD-10-CM | POA: Insufficient documentation

## 2023-02-08 DIAGNOSIS — N8003 Adenomyosis of the uterus: Secondary | ICD-10-CM | POA: Diagnosis not present

## 2023-02-08 DIAGNOSIS — I10 Essential (primary) hypertension: Secondary | ICD-10-CM | POA: Insufficient documentation

## 2023-02-08 DIAGNOSIS — R102 Pelvic and perineal pain: Secondary | ICD-10-CM | POA: Insufficient documentation

## 2023-02-08 DIAGNOSIS — K660 Peritoneal adhesions (postprocedural) (postinfection): Secondary | ICD-10-CM | POA: Insufficient documentation

## 2023-02-08 DIAGNOSIS — Z01818 Encounter for other preprocedural examination: Secondary | ICD-10-CM

## 2023-02-08 HISTORY — PX: ROBOTIC ASSISTED LAPAROSCOPIC HYSTERECTOMY AND SALPINGECTOMY: SHX6379

## 2023-02-08 HISTORY — DX: Benign neoplasm of left adrenal gland: D35.02

## 2023-02-08 HISTORY — DX: Benign intracranial hypertension: G93.2

## 2023-02-08 LAB — ABO/RH: ABO/RH(D): A POS

## 2023-02-08 LAB — POCT PREGNANCY, URINE: Preg Test, Ur: NEGATIVE

## 2023-02-08 SURGERY — XI ROBOTIC ASSISTED LAPAROSCOPIC HYSTERECTOMY AND SALPINGECTOMY
Anesthesia: General | Site: Uterus | Laterality: Bilateral

## 2023-02-08 MED ORDER — MIDAZOLAM HCL 2 MG/2ML IJ SOLN
INTRAMUSCULAR | Status: DC | PRN
  Administered 2023-02-08: 2 mg via INTRAVENOUS

## 2023-02-08 MED ORDER — IBUPROFEN 800 MG PO TABS
800.0000 mg | ORAL_TABLET | Freq: Three times a day (TID) | ORAL | Status: DC
Start: 2023-02-08 — End: 2023-02-10
  Administered 2023-02-08 – 2023-02-09 (×3): 800 mg via ORAL
  Filled 2023-02-08 (×3): qty 1

## 2023-02-08 MED ORDER — ACETAMINOPHEN 500 MG PO TABS
1000.0000 mg | ORAL_TABLET | Freq: Four times a day (QID) | ORAL | Status: DC
  Administered 2023-02-08 – 2023-02-09 (×4): 1000 mg via ORAL
  Filled 2023-02-08 (×4): qty 2

## 2023-02-08 MED ORDER — LACTATED RINGERS IV SOLN: INTRAVENOUS | Status: AC

## 2023-02-08 MED ORDER — CEFAZOLIN SODIUM-DEXTROSE 2-4 GM/100ML-% IV SOLN
INTRAVENOUS | Status: AC
  Filled 2023-02-08: qty 100

## 2023-02-08 MED ORDER — METHYLENE BLUE (ANTIDOTE) 1 % IV SOLN
INTRAVENOUS | Status: DC | PRN
  Administered 2023-02-08: 1 mL

## 2023-02-08 MED ORDER — HEMOSTATIC AGENTS (NO CHARGE) OPTIME
TOPICAL | Status: DC | PRN
  Administered 2023-02-08: 1 via TOPICAL

## 2023-02-08 MED ORDER — OXYCODONE HCL 5 MG PO TABS
ORAL_TABLET | ORAL | Status: AC
  Filled 2023-02-08: qty 1

## 2023-02-08 MED ORDER — LACTATED RINGERS IV SOLN: INTRAVENOUS | Status: DC

## 2023-02-08 MED ORDER — SODIUM CHLORIDE 0.9 % IV SOLN
INTRAVENOUS | Status: DC | PRN
  Administered 2023-02-08: 100 mL

## 2023-02-08 MED ORDER — ONDANSETRON HCL 4 MG PO TABS
4.0000 mg | ORAL_TABLET | Freq: Four times a day (QID) | ORAL | Status: DC | PRN
  Administered 2023-02-09 (×2): 4 mg via ORAL
  Filled 2023-02-08 (×2): qty 1

## 2023-02-08 MED ORDER — CEFAZOLIN SODIUM-DEXTROSE 2-4 GM/100ML-% IV SOLN
2.0000 g | INTRAVENOUS | Status: AC
  Administered 2023-02-08: 2 g via INTRAVENOUS

## 2023-02-08 MED ORDER — MORPHINE SULFATE (PF) 2 MG/ML IV SOLN
1.0000 mg | INTRAVENOUS | Status: DC | PRN
  Administered 2023-02-09 (×2): 1 mg via INTRAVENOUS
  Filled 2023-02-08 (×2): qty 1

## 2023-02-08 MED ORDER — HYDROMORPHONE HCL 1 MG/ML IJ SOLN
INTRAMUSCULAR | Status: AC
  Filled 2023-02-08: qty 1

## 2023-02-08 MED ORDER — SIMETHICONE 80 MG PO CHEW: 80.0000 mg | CHEWABLE_TABLET | Freq: Four times a day (QID) | ORAL | Status: DC | PRN

## 2023-02-08 MED ORDER — ONDANSETRON HCL 4 MG/2ML IJ SOLN
INTRAMUSCULAR | Status: DC | PRN
  Administered 2023-02-08: 4 mg via INTRAVENOUS

## 2023-02-08 MED ORDER — SUGAMMADEX SODIUM 200 MG/2ML IV SOLN
INTRAVENOUS | Status: DC | PRN
  Administered 2023-02-08: 200 mg via INTRAVENOUS

## 2023-02-08 MED ORDER — MENTHOL 3 MG MT LOZG
1.0000 | LOZENGE | OROMUCOSAL | Status: DC | PRN
  Administered 2023-02-08: 3 mg via ORAL
  Filled 2023-02-08: qty 9

## 2023-02-08 MED ORDER — FENTANYL CITRATE (PF) 250 MCG/5ML IJ SOLN
INTRAMUSCULAR | Status: DC | PRN
  Administered 2023-02-08: 25 ug via INTRAVENOUS
  Administered 2023-02-08: 50 ug via INTRAVENOUS
  Administered 2023-02-08 (×2): 25 ug via INTRAVENOUS
  Administered 2023-02-08: 100 ug via INTRAVENOUS

## 2023-02-08 MED ORDER — ORAL CARE MOUTH RINSE
15.0000 mL | Freq: Once | OROMUCOSAL | Status: AC
Start: 2023-02-08 — End: 2023-02-08

## 2023-02-08 MED ORDER — HYDROMORPHONE HCL 1 MG/ML IJ SOLN
0.2500 mg | INTRAMUSCULAR | Status: DC | PRN
  Administered 2023-02-08 (×4): 0.5 mg via INTRAVENOUS

## 2023-02-08 MED ORDER — OXYCODONE HCL 5 MG PO TABS
5.0000 mg | ORAL_TABLET | ORAL | Status: DC | PRN
Start: 2023-02-08 — End: 2023-02-10
  Administered 2023-02-08: 5 mg via ORAL
  Administered 2023-02-08 – 2023-02-09 (×5): 10 mg via ORAL
  Filled 2023-02-08 (×5): qty 2

## 2023-02-08 MED ORDER — PHENYLEPHRINE 80 MCG/ML (10ML) SYRINGE FOR IV PUSH (FOR BLOOD PRESSURE SUPPORT)
PREFILLED_SYRINGE | INTRAVENOUS | Status: DC | PRN
  Administered 2023-02-08 (×3): 80 ug via INTRAVENOUS

## 2023-02-08 MED ORDER — SODIUM CHLORIDE 0.9 % IR SOLN
Status: DC | PRN
  Administered 2023-02-08: 1000 mL via INTRAVESICAL

## 2023-02-08 MED ORDER — DROPERIDOL 2.5 MG/ML IJ SOLN: 0.6250 mg | Freq: Once | INTRAMUSCULAR | Status: DC | PRN

## 2023-02-08 MED ORDER — ORAL CARE MOUTH RINSE: 15.0000 mL | Freq: Once | OROMUCOSAL | Status: AC

## 2023-02-08 MED ORDER — DOCUSATE SODIUM 100 MG PO CAPS
100.0000 mg | ORAL_CAPSULE | Freq: Two times a day (BID) | ORAL | Status: DC
  Administered 2023-02-08 – 2023-02-09 (×2): 100 mg via ORAL
  Filled 2023-02-08 (×2): qty 1

## 2023-02-08 MED ORDER — MIDAZOLAM HCL 2 MG/2ML IJ SOLN
INTRAMUSCULAR | Status: AC
  Filled 2023-02-08: qty 2

## 2023-02-08 MED ORDER — SCOPOLAMINE 1 MG/3DAYS TD PT72
1.0000 | MEDICATED_PATCH | Freq: Once | TRANSDERMAL | Status: DC
  Administered 2023-02-08: 1.5 mg via TRANSDERMAL
  Filled 2023-02-08: qty 1

## 2023-02-08 MED ORDER — IBUPROFEN 800 MG PO TABS
800.0000 mg | ORAL_TABLET | Freq: Three times a day (TID) | ORAL | 0 refills | Status: DC | PRN
Start: 2023-02-08 — End: 2023-05-16
  Filled 2023-02-08: qty 30, 10d supply, fill #0

## 2023-02-08 MED ORDER — PROPOFOL 10 MG/ML IV BOLUS
INTRAVENOUS | Status: AC
Start: 2023-02-08 — End: ?
  Filled 2023-02-08: qty 20

## 2023-02-08 MED ORDER — ONDANSETRON HCL 4 MG/2ML IJ SOLN
4.0000 mg | Freq: Four times a day (QID) | INTRAMUSCULAR | Status: DC | PRN
  Administered 2023-02-08: 4 mg via INTRAVENOUS
  Filled 2023-02-08: qty 2

## 2023-02-08 MED ORDER — FENTANYL CITRATE (PF) 250 MCG/5ML IJ SOLN
INTRAMUSCULAR | Status: AC
  Filled 2023-02-08: qty 5

## 2023-02-08 MED ORDER — DEXAMETHASONE SODIUM PHOSPHATE 10 MG/ML IJ SOLN
INTRAMUSCULAR | Status: DC | PRN
  Administered 2023-02-08: 10 mg via INTRAVENOUS

## 2023-02-08 MED ORDER — LIDOCAINE 2% (20 MG/ML) 5 ML SYRINGE
INTRAMUSCULAR | Status: DC | PRN
  Administered 2023-02-08: 100 mg via INTRAVENOUS

## 2023-02-08 MED ORDER — ALBUMIN HUMAN 5 % IV SOLN: INTRAVENOUS | Status: DC | PRN

## 2023-02-08 MED ORDER — CHLORHEXIDINE GLUCONATE 0.12 % MT SOLN
OROMUCOSAL | Status: AC
  Administered 2023-02-08: 15 mL via OROMUCOSAL
  Filled 2023-02-08: qty 15

## 2023-02-08 MED ORDER — OXYCODONE HCL 5 MG PO TABS
5.0000 mg | ORAL_TABLET | Freq: Four times a day (QID) | ORAL | 0 refills | Status: DC | PRN
  Filled 2023-02-08: qty 20, 5d supply, fill #0

## 2023-02-08 MED ORDER — ROCURONIUM BROMIDE 10 MG/ML (PF) SYRINGE
PREFILLED_SYRINGE | INTRAVENOUS | Status: DC | PRN
  Administered 2023-02-08: 30 mg via INTRAVENOUS
  Administered 2023-02-08: 70 mg via INTRAVENOUS
  Administered 2023-02-08: 10 mg via INTRAVENOUS

## 2023-02-08 MED ORDER — CHLORHEXIDINE GLUCONATE 0.12 % MT SOLN: 15.0000 mL | Freq: Once | OROMUCOSAL | Status: AC

## 2023-02-08 MED ORDER — SODIUM CHLORIDE 0.9 % IR SOLN
Status: DC | PRN
  Administered 2023-02-08: 1000 mL

## 2023-02-08 MED ORDER — ACETAMINOPHEN 500 MG PO TABS
1000.0000 mg | ORAL_TABLET | Freq: Once | ORAL | Status: AC
  Administered 2023-02-08: 1000 mg via ORAL
  Filled 2023-02-08: qty 2

## 2023-02-08 MED ORDER — CHLORHEXIDINE GLUCONATE 0.12 % MT SOLN
15.0000 mL | Freq: Once | OROMUCOSAL | Status: AC
Start: 2023-02-08 — End: 2023-02-08

## 2023-02-08 MED ORDER — PHENYLEPHRINE HCL-NACL 20-0.9 MG/250ML-% IV SOLN
INTRAVENOUS | Status: DC | PRN
  Administered 2023-02-08: 20 ug/min via INTRAVENOUS

## 2023-02-08 MED ORDER — PROPOFOL 10 MG/ML IV BOLUS
INTRAVENOUS | Status: DC | PRN
  Administered 2023-02-08: 200 mg via INTRAVENOUS

## 2023-02-08 SURGICAL SUPPLY — 54 items
APPLICATOR ARISTA FLEXITIP XL (MISCELLANEOUS) IMPLANT
CANNULA CAP OBTURATR AIRSEAL 8 (CAP) ×1 IMPLANT
COVER BACK TABLE 60X90IN (DRAPES) ×2 IMPLANT
COVER TIP SHEARS 8 DVNC (MISCELLANEOUS) ×1 IMPLANT
DEFOGGER SCOPE WARMER CLEARIFY (MISCELLANEOUS) ×1 IMPLANT
DERMABOND ADVANCED .7 DNX12 (GAUZE/BANDAGES/DRESSINGS) ×1 IMPLANT
DILATOR CANAL MILEX (MISCELLANEOUS) IMPLANT
DRAPE ARM DVNC X/XI (DISPOSABLE) ×4 IMPLANT
DRAPE COLUMN DVNC XI (DISPOSABLE) ×2 IMPLANT
DRAPE UTILITY W/TAPE 26X15 (DRAPES) ×1 IMPLANT
DRIVER NDL MEGA 8 DVNC XI (INSTRUMENTS) ×2 IMPLANT
DRIVER NDLE MEGA DVNC XI (INSTRUMENTS) ×1
DURAPREP 26ML APPLICATOR (WOUND CARE) ×2 IMPLANT
ELECT REM PT RETURN 9FT ADLT (ELECTROSURGICAL) ×1
ELECTRODE REM PT RTRN 9FT ADLT (ELECTROSURGICAL) ×1 IMPLANT
FORCEPS BPLR LNG DVNC XI (INSTRUMENTS) ×1 IMPLANT
FORCEPS LONG TIP 8 DVNC XI (FORCEP) ×1 IMPLANT
FORCEPS PROGRASP DVNC XI (FORCEP) ×2 IMPLANT
GLOVE BIO SURGEON STRL SZ 6.5 (GLOVE) ×6 IMPLANT
GLOVE BIOGEL PI IND STRL 7.0 (GLOVE) ×5 IMPLANT
HEMOSTAT ARISTA ABSORB 3G PWDR (HEMOSTASIS) IMPLANT
HIBICLENS CHG 4% 4OZ BTL (MISCELLANEOUS) ×2 IMPLANT
IRRIGATION STRYKERFLOW (MISCELLANEOUS) ×1 IMPLANT
IRRIGATOR STRYKERFLOW (MISCELLANEOUS) ×1
KIT PINK PAD W/HEAD ARE REST (MISCELLANEOUS) ×1
KIT PINK PAD W/HEAD ARM REST (MISCELLANEOUS) ×1 IMPLANT
LEGGING LITHOTOMY PAIR STRL (DRAPES) ×2 IMPLANT
NDL 18GX1X1/2 (RX/OR ONLY) (NEEDLE) IMPLANT
NEEDLE 18GX1X1/2 (RX/OR ONLY) (NEEDLE) ×1
OBTURATOR OPTICAL STND 8 DVNC (TROCAR) ×1
OBTURATOR OPTICALSTD 8 DVNC (TROCAR) ×1 IMPLANT
OCCLUDER COLPOPNEUMO (BALLOONS) ×1 IMPLANT
PACK ROBOT WH (CUSTOM PROCEDURE TRAY) ×1 IMPLANT
PACK ROBOTIC GOWN (GOWN DISPOSABLE) ×2 IMPLANT
PAD OB MATERNITY 4.3X12.25 (PERSONAL CARE ITEMS) ×1 IMPLANT
POWDER SURGICEL 3.0 GRAM (HEMOSTASIS) IMPLANT
SCISSORS LAP 5X35 DISP (ENDOMECHANICALS) IMPLANT
SCISSORS MNPLR CVD DVNC XI (INSTRUMENTS) ×1 IMPLANT
SEAL UNIV 5-12 XI (MISCELLANEOUS) ×8 IMPLANT
SEALER VESSEL EXT DVNC XI (MISCELLANEOUS) ×1 IMPLANT
SET IRRIG Y TYPE TUR BLADDER L (SET/KITS/TRAYS/PACK) IMPLANT
SET TUBE FILTERED XL AIRSEAL (SET/KITS/TRAYS/PACK) ×1 IMPLANT
SPIKE FLUID TRANSFER (MISCELLANEOUS) ×4 IMPLANT
SUT VIC AB 0 CT1 27XBRD ANBCTR (SUTURE) ×2 IMPLANT
SUT VIC AB CT1 27XBRD ANBCTRL (SUTURE) ×2
SUT VICRYL 0 UR6 27IN ABS (SUTURE) IMPLANT
SUT VICRYL RAPIDE 4/0 PS 2 (SUTURE) ×4 IMPLANT
SUT VLOC 180 0 9IN GS21 (SUTURE) ×1 IMPLANT
SYR 5ML LUER SLIP (SYRINGE) IMPLANT
TIP ENDOSCOPIC SURGICEL (TIP) IMPLANT
TIP UTERINE 6.7X8CM BLUE DISP (MISCELLANEOUS) IMPLANT
TOWEL GREEN STERILE (TOWEL DISPOSABLE) ×1 IMPLANT
TRAY FOLEY MTR SLVR 14FR STAT (SET/KITS/TRAYS/PACK) ×1 IMPLANT
UNDERPAD 30X36 HEAVY ABSORB (UNDERPADS AND DIAPERS) ×1 IMPLANT

## 2023-02-08 NOTE — Transfer of Care (Signed)
Immediate Anesthesia Transfer of Care Note  Patient: Julie Schwartz  Procedure(s) Performed: XI ROBOTIC ASSISTED TOTAL LAPAROSCOPIC HYSTERECTOMY AND SALPINGECTOMY (Bilateral: Uterus)  Patient Location: PACU  Anesthesia Type:General  Level of Consciousness: drowsy, patient cooperative, and responds to stimulation  Airway & Oxygen Therapy: Patient Spontanous Breathing  Post-op Assessment: Report given to RN and Post -op Vital signs reviewed and stable  Post vital signs: Reviewed and stable  Last Vitals:  Vitals Value Taken Time  BP 113/76 02/08/23 1617  Temp    Pulse 65 02/08/23 1620  Resp 17 02/08/23 1620  SpO2 95 % 02/08/23 1620  Vitals shown include unfiled device data.  Last Pain:  Vitals:   02/08/23 0946  TempSrc:   PainSc: 0-No pain         Complications: No notable events documented.

## 2023-02-08 NOTE — Anesthesia Procedure Notes (Addendum)
Procedure Name: Intubation Date/Time: 02/08/2023 1:01 PM  Performed by: Garfield Cornea, CRNAPre-anesthesia Checklist: Patient identified, Emergency Drugs available, Suction available and Patient being monitored Patient Re-evaluated:Patient Re-evaluated prior to induction Oxygen Delivery Method: Circle System Utilized Preoxygenation: Pre-oxygenation with 100% oxygen Induction Type: IV induction Ventilation: Mask ventilation without difficulty Laryngoscope Size: Mac and 3 Grade View: Grade I Tube type: Oral Tube size: 7.0 mm Number of attempts: 1 Airway Equipment and Method: Stylet Placement Confirmation: ETT inserted through vocal cords under direct vision, positive ETCO2 and breath sounds checked- equal and bilateral Secured at: 22 cm Tube secured with: Tape Dental Injury: Teeth and Oropharynx as per pre-operative assessment

## 2023-02-08 NOTE — Interval H&P Note (Signed)
History and Physical Interval Note:  02/08/2023 11:44 AM  Julie Schwartz  has presented today for surgery, with the diagnosis of Abnormal uterine bleeding  Pelvic pain.  The various methods of treatment have been discussed with the patient and family. After consideration of risks, benefits and other options for treatment, the patient has consented to  Procedure(s): XI ROBOTIC ASSISTED TOTAL LAPAROSCOPIC HYSTERECTOMY AND SALPINGECTOMY (Bilateral) as a surgical intervention.  The patient's history has been reviewed, patient examined, no change in status, stable for surgery.  I have reviewed the patient's chart and labs.  Questions were answered to the patient's satisfaction.     Steva Ready

## 2023-02-08 NOTE — Discharge Summary (Signed)
Physician Discharge Summary  Patient ID: Julie Schwartz MRN: [id number] DOB/AGE: Jan 16, 1976 47 y.o.  Admit date: 02/08/2023 Discharge date: 02/09/2023  Admission Diagnoses: Abnormal uterine bleeding Uterine fibroids Pelvic pain  Discharge Diagnoses:  Principal Problem:   Abnormal uterine bleeding (AUB) Active Problems:   Pelvic pain  Procedure(s): 1. Robotic Assisted Total Laparoscopic Hysterectomy and Bilateral Salpingectomy  2. Lysis of Adhesions   Discharged Condition: good  Hospital Course: Patient was admitted on 02/08/2023 for the above named procedure(s) for the above named diagnoses. Prior to hospital discharge, patient was tolerating PO, ambulating, voiding spontaneously, passing flatus, and pain was well-controlled. See hospital chart for specific details. Patient was discharged home in stable condition.  Consults: None  Significant Diagnostic Studies: None  Treatments: surgery: As above  Discharge Exam: Blood pressure (!) 121/53, pulse 61, temperature 98.8 F (37.1 C), temperature source Oral, resp. rate 18, height 5\' 9"  (1.753 m), weight 101 kg, last menstrual period 11/21/2022, SpO2 98%. General appearance: alert, cooperative, and no distress Resp: no distress  GI: soft appropriately tender non-distended  Extremities: extremities normal, atraumatic, no cyanosis or edema Incision/Wound: well approximated no erythema or exudate  Disposition:    Discharge Instructions     Call MD for:   Complete by: As directed    Spotting to very light bleeding is normal as the vaginal cuff is healing. Call with any heavy vaginal bleeding (filling up more than one large pad in an hour).   Call MD for:  difficulty breathing, headache or visual disturbances   Complete by: As directed    Call MD for:  extreme fatigue   Complete by: As directed    Call MD for:  hives   Complete by: As directed    Call MD for:  persistant dizziness or light-headedness   Complete by: As  directed    Call MD for:  persistant nausea and vomiting   Complete by: As directed    Call MD for:  redness, tenderness, or signs of infection (pain, swelling, redness, odor or green/yellow discharge around incision site)   Complete by: As directed    Call MD for:  severe uncontrolled pain   Complete by: As directed    Call MD for:  temperature >100.4   Complete by: As directed    Diet - low sodium heart healthy   Complete by: As directed    Driving Restrictions   Complete by: As directed    No driving for at least 2 weeks.   Increase activity slowly   Complete by: As directed    Lifting restrictions   Complete by: As directed    No lifting greater than 10lbs for 6 weeks.   Other Restrictions   Complete by: As directed    No baths or submersion in water for at least 6 weeks.   Sexual Activity Restrictions   Complete by: As directed    No sexual intercourse or objects in the vagina for at least 6 weeks.      Allergies as of 02/09/2023       Reactions   Lyrica [pregabalin] Other (See Comments)   drowsy        Medication List     TAKE these medications    acetaZOLAMIDE 250 MG tablet Commonly known as: DIAMOX Take 1 tablet (250 mg total) by mouth 2 (two) times daily.   amitriptyline 25 MG tablet Commonly known as: ELAVIL at bedtime as needed.   amLODipine 10 MG tablet Commonly known as:  NORVASC Take 1 tablet by mouth in the morning and at bedtime.   doxazosin 1 MG tablet Commonly known as: CARDURA Take 1 tablet by mouth 2 (two) times daily.   ibuprofen 800 MG tablet Commonly known as: ADVIL Take 1 tablet (800 mg total) by mouth 3 (three) times daily. What changed:  when to take this reasons to take this   ibuprofen 800 MG tablet Commonly known as: ADVIL Take 1 tablet (800 mg total) by mouth every 8 (eight) hours as needed for mild pain (pain score 1-3), moderate pain (pain score 4-6) or cramping. What changed: You were already taking a medication with  the same name, and this prescription was added. Make sure you understand how and when to take each.   lisinopril 10 MG tablet Commonly known as: ZESTRIL Take 1 tablet by mouth daily.   ondansetron 4 MG tablet Commonly known as: ZOFRAN Take 1 tablet (4 mg total) by mouth every 6 (six) hours as needed for nausea.   oxyCODONE 5 MG immediate release tablet Commonly known as: Oxy IR/ROXICODONE Take 1 tablet (5 mg total) by mouth every 6 (six) hours as needed for breakthrough pain or severe pain (pain score 7-10).   propranolol ER 160 MG SR capsule Commonly known as: INDERAL LA Take 160 mg by mouth at bedtime.        Follow-up Information     Steva Ready, DO Follow up in 2 week(s).   Specialty: Obstetrics and Gynecology Why: Please keep your 2 week post-operative visit scheduled on 02/22/2023 at 9:15am and your 6 week post-operative visit scheduled on 03/24/2023 at 9:15am. Contact information: [address] [place] [address] [phone]                 Signed: Gerald Leitz 02/09/2023, 6:01 PM

## 2023-02-08 NOTE — Op Note (Signed)
Pre Op Dx:   1. Abnormal uterine bleeding 2. Uterine fibroids 3. Pelvic pain  Post Op Dx:   Same as pre-operative diagnoses  Procedure:   1. Robotic Assisted Total Laparoscopic Hysterectomy and Bilateral Salpingectomy  2. Lysis of Adhesions   Surgeon:  Dr. Steva Ready Assistants:  Karmen Stabs, RNFA Anesthesia:  General   EBL: 20cc  IVF:  800cc crystalloid, 250cc Albumin UOP:  150cc   Drains:  Foley catheter Specimen removed:  Uterus, cervix, bilateral fallopian tubes - sent to pathology Device(s) implanted: None Case Type:  Clean-contaminated Findings:  Normal-appearing cervix. Normal-sized fibroid uterus. Normal-appearing bilateral fallopian tubes with evidence of prior ligation. Normal-appearing bilateral ovaries. Right ovary with a ~2cm serous-appearing cyst. Normal-appearing liver contours. Omental adhesions to the anterior abdominal wall in the midline. Filmy adhesions between the right ovary to the right posterior surface of the uterus. Filmy adhesion band between the right fallopian tube and epiploic fat within the right pelvis. Dense adhesions between the bladder to the mid-portion of the anterior uterus. Normal-appearing appendix. Bilateral ureters visualized with peristalsis pre and post hysterectomy and after closure of the vaginal cuff. Complications: None Indications:  46 y.o. G2P2 s/p BTL who desires definitive surgical management for AUB and pelvic pain.  Description of each procedure:  After informed consent the patient was taken to the operating room and placed in dorsal supine position where general endotracheal anesthesia was administered and found to be adequate.  She was placed in dorsal lithotomy position with her arms tucked.  She was prepped and draped in the usual sterile fashion.  A timeout was called and the procedure confirmed.  A RUMI uterine manipulator with the Koh cup and a Foley catheter were placed.   A 8mm supraumbilical incision was made and a  trochar was used to enter the abdomen under direct visualization.  Pneumoperitoneum was established and atraumatic entry confirmed. Three additional 8mm ports were placed on either side of the umbilicus under direct visualization. All port sites were injected with 10cc local anesthetic. The pelvis was bathed in a 60cc Ropivicaine solution.  The omental adhesions were taken down sharply. Hemostasis noted. The patient was then placed in Trendelenburg position and the [employer] robotic device was docked.  Next, attention was turned to the console where the hysterectomy was performed. The filmy adhesions as described above were taken down with electrosurgery.  The fimbriated portion of the right fallopian tube was divided at the mesosalpinx and removed. This process was completed on the contralateral side. The right fallopian tube was divided at the mesosalpinx and the uteroovarian anastamosis was divided and the right round ligament was divided. This process was repeated on the contralateral side.  The anterior leaflet of the broad ligament was divided to create a bladder flap with moderate difficulty. Due to the dense adhesions between the bladder and midportion of the anterior uterus, the bladder was backfilled with 250cc of Methylene blue to aid with identification of tissue planes. The bladder was drained and the bladder flap was created without issue. The uterine artery and vein were skeletonized and desiccated superior to the Koh cup.  This process was repeated on the contralateral side.  Uterine blanching was observed.  A circumferential colpotomy was created along the ridge of the Koh cup and the uterus was passed off the field. The vaginal occluder was placed in the vagina to maintain pneumoperitoneum.  The vaginal cuff was then closed with V-loc suture. Hemostasis confirmed. Surgicel hemostatic powder was placed on the  vaginal cuff and adnexa bilaterally. Copious suction-irrigation performed. The bladder  was backfilled again with 200cc of Methylene blue without any evidence of extravasation of fluid. The [employer] robotic device was undocked and all ports were visualized.   The pneumoperitoneum was reduced completely with the assistance of two deep breaths and all ports were removed.  The skin was closed with 4-0 Vicryl in subcuticular fashion with skin glue placed atop each port site.   The vagina was inspected and there were no vaginal tears noted and no foreign objects remaining in the vagina. The vaginal cuff palpated to be intact. The patient was returned to dorsal supine position, awakened and extubated in the OR having appeared to tolerate the procedure well.  All sponge, needle, and instrument counts were correct x 2 at the end of the case.  Disposition:  PACU  Steva Ready, DO

## 2023-02-08 NOTE — Anesthesia Postprocedure Evaluation (Signed)
Anesthesia Post Note  Patient: ENGIE PERLSTEIN  Procedure(s) Performed: XI ROBOTIC ASSISTED TOTAL LAPAROSCOPIC HYSTERECTOMY AND SALPINGECTOMY (Bilateral: Uterus)     Patient location during evaluation: PACU Anesthesia Type: General Level of consciousness: awake and alert Pain management: pain level controlled Vital Signs Assessment: post-procedure vital signs reviewed and stable Respiratory status: spontaneous breathing, nonlabored ventilation and respiratory function stable Cardiovascular status: blood pressure returned to baseline Postop Assessment: no apparent nausea or vomiting Anesthetic complications: no   No notable events documented.  Last Vitals:  Vitals:   02/08/23 1700 02/08/23 1715  BP: 114/79 110/71  Pulse: (!) 59 60  Resp: 11 12  Temp:  36.6 C  SpO2: 99% 99%    Last Pain:  Vitals:   02/08/23 1715  TempSrc:   PainSc: 4                  Shanda Howells

## 2023-02-08 NOTE — Progress Notes (Signed)
Call made to primary RN and patient via Vocera. Patient receiving Oxycodone for pain at this time. Has not yet had PFV but will try soon. Vital signs reviewed and are unremarkable.  Steva Ready, DO

## 2023-02-09 ENCOUNTER — Other Ambulatory Visit (HOSPITAL_COMMUNITY): Payer: Self-pay

## 2023-02-09 ENCOUNTER — Encounter (HOSPITAL_COMMUNITY): Payer: Self-pay | Admitting: Obstetrics and Gynecology

## 2023-02-09 DIAGNOSIS — N939 Abnormal uterine and vaginal bleeding, unspecified: Secondary | ICD-10-CM | POA: Diagnosis not present

## 2023-02-09 LAB — CBC
HCT: 29.6 % — ABNORMAL LOW (ref 36.0–46.0)
Hemoglobin: 9.4 g/dL — ABNORMAL LOW (ref 12.0–15.0)
MCH: 30.9 pg (ref 26.0–34.0)
MCHC: 31.8 g/dL (ref 30.0–36.0)
MCV: 97.4 fL (ref 80.0–100.0)
Platelets: 229 10*3/uL (ref 150–400)
RBC: 3.04 MIL/uL — ABNORMAL LOW (ref 3.87–5.11)
RDW: 12.4 % (ref 11.5–15.5)
WBC: 9.2 10*3/uL (ref 4.0–10.5)
nRBC: 0 % (ref 0.0–0.2)

## 2023-02-09 MED ORDER — ONDANSETRON HCL 4 MG PO TABS: 4.0000 mg | ORAL_TABLET | Freq: Four times a day (QID) | ORAL | 0 refills | Status: DC | PRN

## 2023-02-10 LAB — SURGICAL PATHOLOGY

## 2023-02-15 NOTE — H&P (Signed)
Julie Schwartz is an 47 y.o. [phone] s/p BTL who is admitted for Robotic Assisted Total Laparoscopic Hysterectomy and Bilateral Salpingectomy for AUB and pelvic pain.   Patient has had pain specifically in the LLQ x 1 year that has progressively gotten worse. Pt reports that pelvic pain started a year ago in left lower pelvis that was a dull ache similar to a contraction. She had been having heavy cycles and cramping, but she had been having pain outside of her cycles, but it subsided. Pt was working out of town in [place] last year on a work trip and had constant pelvic pain and 3 weeks after returning home the pain was more intense and still persistent. She had very intense pain for a week and had period-like symptoms but then she had very sharp stabbing pain that radiated to her back. She went to the ER in [place] and had CT scan which showed fibroids and an adrenal adenoma for which she follows with Endocrinology. She has also consulted with general surgery but due to its inactivity, she states it will be monitored at this time. Pt reports that the pain is now constant and she is having fatigue, headache, and high blood pressure. She is having a regular cycles that are heavy but vary in length. She has been following with GI as well. She has a decreased quality of life due to the ongoing pain.  She understands that she still may have pelvic pain, even after definitive management with hysterectomy.   Of note, pt has had two c-sections and BTL with last c-section in 2021.   Work-up: Pap smear (12/08/2022): NILM/HRHPV negative   EMB (08/01/2022): Menstrual endometrium. No evidence of hyperplasia, endometritis, or atypia.   TVUS (07/06/2022): Uterus: 8.79 x 5.45 x 5.66 cm Endometrial thickness: 0.43 cm Fibroid 1 1.77 cm  Fibroid 2 0.81 cm  Fibroid 3 1.03 cm  Fibroid 4 1.51 cm Right Ovary 3.37 cm  Left Ovary 2.90 cm Anteverted uterus. Uterus inhomogenous in apparance. Small uterine fibroids-  anterior fundal 1.8 x 1.6 x 1.4 cm, anterior 0.8 x 0.8 x 0.8 cm, right 1.0 x 1.0 x 1.0 cm, posterior 1.5 x 1.5 x 1.5 cm; fibroids stable in size from previous US 03/31/2021. Endometrium WNL (Pt on cycle now). Bilateral ovaries WNL. No adnexal masses seen.   CT A/P (07/01/2022): Reproductive: Uterus and bileral adnexa are unremarkable.       Patient Active Problem List    Diagnosis Date Noted   Abnormal uterine bleeding (AUB) 12/14/2022   Pelvic pain 12/14/2022   Myalgia and myositis, unspecified 11/28/2012   Headache(784.0) 11/28/2012   Disturbance of skin sensation 11/28/2012      MEDICAL/FAMILY/SOCIAL HX: No LMP recorded. (Menstrual status: Other).       Past Medical History:  Diagnosis Date   Anemia     Depression     Fibromyalgia     Headache(784.0) 11/28/2012   Hypertension      since the headaches   Iron deficiency anemia     Migraine     Myalgia     Myalgia and myositis, unspecified 11/28/2012   Obesity     Panic attack                 Past Surgical History:  Procedure Laterality Date   CARPAL TUNNEL RELEASE Right     CESAREAN SECTION       TUBAL LIGATION  Family History  Problem Relation Age of Onset   Cancer - Lung Mother     Depression Father     Lupus Paternal Aunt     Breast cancer Maternal Grandmother     Arthritis/Rheumatoid Maternal Grandmother     Other Maternal Grandmother          Kidney Issues   Other Maternal Grandfather     Hypertension Maternal Grandfather            Social History:  reports that she has never smoked. She has never used smokeless tobacco. She reports that she does not currently use alcohol. She reports that she does not use drugs.   ALLERGIES/MEDS:   Allergies:  Allergies       Allergies  Allergen Reactions   Lyrica [Pregabalin] Other (See Comments)      drowsy        No medications prior to admission.            Review of Systems  Constitutional: Negative.   HENT: Negative.     Eyes: Negative.   Respiratory: Negative.    Cardiovascular: Negative.   Gastrointestinal: Negative.   Genitourinary: Negative.   Musculoskeletal: Negative.   Skin: Negative.   Neurological: Negative.   Endo/Heme/Allergies: Negative.   Psychiatric/Behavioral: Negative.        There were no vitals taken for this visit. Gen:  NAD, pleasant and cooperative Cardio:  RRR Pulm:  CTAB, no wheezes/rales/rhonchi Abd:  Soft, non-distended, non-tender throughout, no rebound/guarding Ext:  No bilateral LE edema, no bilateral calf tenderness Pelvic: Labia - unremarkable, vagina - pink moist mucosa, no lesions or abnormal discharge, cervix - no discharge or lesions or CMT   Lab Results Last 24 Hours  No results found for this or any previous visit (from the past 24 hour(s)).     Imaging Results (Last 48 hours)  No results found.       ASSESSMENT/PLAN: Julie Schwartz is a 47 y.o. [phone] s/p BTL who is admitted for Robotic Assisted Total Laparoscopic Hysterectomy and Bilateral Salpingectomy for AUB and pelvic pain.   - Admit to [facility] - Admit labs (CBC, T&S) - Diet:  Per anesthesia/ERAS pathway - IVF:  Per anesthesia - VTE Prophylaxis:  SCDs - Antibiotics: Ancef 2g on call to OR - D/C home POD#1   Consents: I have explained to the patient that his surgery is performed to remove the uterus through several small incisions in the abdomen and that it will result in sterility.  I discussed the risks and benefits of the surgery, including, but not limited to bleeding, including the need for blood transfusion, infection, damage to surround organs and tissues, damage to bladder, damage to ureters, causing kidney damage, and requiring additional procedures, damage to bowels, resulting in further surgery, postoperative pain, short-term and long-term, scarring on the abdominal wall and intra-abdominally, need for further surgery, need for conversion to an open procedure, development of an incisional  hernia, deep vein thrombosis, and/or pulmonary embolism, wound infection and/or separation, painful intercourse, urinary leakage, ovarian failure, resulting in menopausal symptoms requiring treatment, fistula formation, complications the course of which cannot be predicted or prevented, and death. Patient was consented for blood products.  The patient is aware that bleeding may result in the need for a blood transfusion which includes risk of transmission of HIV (1:2 million), Hepatitis C (1:2 million), and Hepatitis B (1:200 thousand) and transfusion reaction.  Patient voiced understanding of the above risks as well as understanding  of indications for blood transfusion.   Steva Ready, DO

## 2023-04-03 ENCOUNTER — Ambulatory Visit: Payer: Medicaid Other | Admitting: Neurology

## 2023-05-02 ENCOUNTER — Ambulatory Visit: Payer: Self-pay | Admitting: Surgery

## 2023-05-21 ENCOUNTER — Encounter (HOSPITAL_COMMUNITY): Payer: Self-pay | Admitting: Surgery

## 2023-05-21 DIAGNOSIS — L720 Epidermal cyst: Secondary | ICD-10-CM | POA: Diagnosis present

## 2023-05-21 NOTE — Patient Instructions (Signed)
 SURGICAL WAITING ROOM VISITATION Patients having surgery or a procedure may have no more than 2 support people in the waiting area - these visitors may rotate in the visitor waiting room.   Due to an increase in RSV and influenza rates and associated hospitalizations, children ages 4 and under may not visit patients in [facility] hospitals. If the patient needs to stay at the hospital during part of their recovery, the visitor guidelines for inpatient rooms apply.  PRE-OP VISITATION  Pre-op nurse will coordinate an appropriate time for 1 support person to accompany the patient in pre-op.  This support person may not rotate.  This visitor will be contacted when the time is appropriate for the visitor to come back in the pre-op area.  Please refer to the [facility] website for the visitor guidelines for Inpatients (after your surgery is over and you are in a regular room).  You are not required to quarantine at this time prior to your surgery. However, you must do this: Hand Hygiene often Do NOT share personal items Notify your provider if you are in close contact with someone who has COVID or you develop fever 100.4 or greater, new onset of sneezing, cough, sore throat, shortness of breath or body aches.  If you test positive for Covid or have been in contact with anyone that has tested positive in the last 10 days please notify you surgeon.    Your procedure is scheduled on:  FRIDAY  May 26, 2023  Report to [facility] Main Entrance: [address] entrance where the [employer] is available.   Report to admitting at:  09:15   AM  Call this number if you have any questions or problems the morning of surgery [phone]  FOLLOW ANY ADDITIONAL PRE OP INSTRUCTIONS YOU RECEIVED FROM YOUR SURGEON'S OFFICE!!!  Do not eat food after Midnight the night prior to your surgery/procedure.  After Midnight you may have the following liquids until   08:30 AM DAY OF SURGERY  Clear Liquid  Diet Water Black Coffee (sugar ok, NO MILK/CREAM OR CREAMERS)  Tea (sugar ok, NO MILK/CREAM OR CREAMERS) regular and decaf                             Plain Jell-O  with no fruit (NO RED)                                           Fruit ices (not with fruit pulp, NO RED)                                     Popsicles (NO RED)                                                                  Juice: NO CITRUS JUICES: only apple, WHITE grape, WHITE cranberry Sports drinks like Gatorade or Powerade (NO RED)                Oral Hygiene is also important to reduce your risk  of infection.        Remember - BRUSH YOUR TEETH THE MORNING OF SURGERY WITH YOUR REGULAR TOOTHPASTE  Do NOT smoke after Midnight the night before surgery.  STOP TAKING all Vitamins, Herbs and supplements 1 week before your surgery.   Take ONLY these medicines the morning of surgery with A SIP OF WATER: amlodipine, doxazosin, gabapentin, and Tylenol if needed.   If You have been diagnosed with Sleep Apnea - Bring CPAP mask and tubing day of surgery. We will provide you with a CPAP machine on the day of your surgery.                   You may not have any metal on your body including hair pins, jewelry, and body piercing  Do not wear make-up, lotions, powders, perfumes or deodorant  Do not wear nail polish including gel and S&S, artificial / acrylic nails, or any other type of covering on natural nails including finger and toenails. If you have artificial nails, gel coating, etc., that needs to be removed by a nail salon, Please have this removed prior to surgery. Not doing so may mean that your surgery could be cancelled or delayed if the Surgeon or anesthesia staff feels like they are unable to monitor you safely.   Do not shave 48 hours prior to surgery to avoid nicks in your skin which may contribute to postoperative infections.   Contacts, Hearing Aids, dentures or bridgework may not be worn into surgery. DENTURES WILL BE  REMOVED PRIOR TO SURGERY PLEASE DO NOT APPLY "Poly grip" OR ADHESIVES!!!  Patients discharged on the day of surgery will not be allowed to drive home.  Someone NEEDS to stay with you for the first 24 hours after anesthesia.  Do not bring your home medications to the hospital. The Pharmacy will dispense medications listed on your medication list to you during your admission in the Hospital.  Please read over the following fact sheets you were given: IF YOU HAVE QUESTIONS ABOUT YOUR PRE-OP INSTRUCTIONS, PLEASE CALL [phone]   [facility] - Preparing for Surgery Before surgery, you can play an important role.  Because skin is not sterile, your skin needs to be as free of germs as possible.  You can reduce the number of germs on your skin by washing with CHG (chlorahexidine gluconate) soap before surgery.  CHG is an antiseptic cleaner which kills germs and bonds with the skin to continue killing germs even after washing. Please DO NOT use if you have an allergy to CHG or antibacterial soaps.  If your skin becomes reddened/irritated stop using the CHG and inform your nurse when you arrive at Short Stay. Do not shave (including legs and underarms) for at least 48 hours prior to the first CHG shower.  You may shave your face/neck.  Please follow these instructions carefully:  1.  Shower with CHG Soap the night before surgery and the  morning of surgery.  2.  If you choose to wash your hair, wash your hair first as usual with your normal  shampoo.  3.  After you shampoo, rinse your hair and body thoroughly to remove the shampoo.                             4.  Use CHG as you would any other liquid soap.  You can apply chg directly to the skin and wash.  Gently with a  scrungie or clean washcloth.  5.  Apply the CHG Soap to your body ONLY FROM THE NECK DOWN.   Do not use on face/ open                           Wound or open sores. Avoid contact with eyes, ears mouth and genitals (private parts).                        Wash face,  Genitals (private parts) with your normal soap.             6.  Wash thoroughly, paying special attention to the area where your  surgery  will be performed.  7.  Thoroughly rinse your body with warm water from the neck down.  8.  DO NOT shower/wash with your normal soap after using and rinsing off the CHG Soap.            9.  Pat yourself dry with a clean towel.            10.  Wear clean pajamas.            11.  Place clean sheets on your bed the night of your first shower and do not  sleep with pets.  ON THE DAY OF SURGERY : Do not apply any lotions/deodorants the morning of surgery.  Please wear clean clothes to the hospital/surgery center.     FAILURE TO FOLLOW THESE INSTRUCTIONS MAY RESULT IN THE CANCELLATION OF YOUR SURGERY  PATIENT SIGNATURE_________________________________  NURSE SIGNATURE__________________________________  ________________________________________________________________________

## 2023-05-21 NOTE — Progress Notes (Signed)
 The patient was identified using 2 approved identifiers. All issues noted in this document were discussed and addressed, Ms. Julie Schwartz voiced understanding and agreement with all preoperative instructions. The patient was emailed the surgery instructions per her request.     The patient was instructed to call Admitting Office [phone] or [phone]) to complete their Pre-surgical Interview.    Medical reconciliation done 05-16-2023  COVID Vaccine received:  []  No [x]  Yes Date of any COVID positive Test in last 90 days:  None  PCP - Georgann Housekeeper, MD  [phone]  Cardiologist - none Neurology- Dr. Nita Sickle  Chest x-ray - 10-17-2015  2v  E[facility]ic EKG -  10-05-2022  Epic Stress Test -  ECHO -  Cardiac Cath -   PCR screen: []  Ordered & Completed []   No Order but Needs PROFEND     [x]   N/A for this surgery  Surgery Plan:  [x]  Ambulatory   []  Outpatient in bed  []  Admit Anesthesia:    []  General  []  Spinal  []   Choice [x]   MAC  Pacemaker / ICD device [x]  No []  Yes   Spinal Cord Stimulator:[x]  No []  Yes       History of Sleep Apnea? []  No [x]  Yes   CPAP used?- [x]  No []  Yes    Does the patient monitor blood sugar?   [x]  N/A   []  No []  Yes  Patient has: [x]  NO Hx DM   []  Pre-DM   []  DM1  []   DM2  Blood Thinner / Instructions:  none Aspirin Instructions:  none  ERAS Protocol Ordered: []  No  [x]  Yes PRE-SURGERY []  ENSURE  []  G2   [x]  No Drink Ordered Patient is to be NPO after: 0830  Dental hx: []  Dentures:  [x]  N/A      []  Bridge or Partial:                   []  Loose or Damaged teeth:   Activity level: Patient is able climb a flight of stairs without difficulty; [x]  No CP  [x]  No SOB Patient can perform ADLs without assistance.   Anesthesia review: HTN, anemia, anxiety, Pseudotumor Cerebri, OSA- no CPAP  Patient denies shortness of breath, fever, cough and chest pain at PAT appointment.  Patient verbalized understanding and agreement to the Pre-Surgical  Instructions that were given to them at this PAT appointment. Patient was also educated of the need to review these PAT instructions again prior to her surgery.I reviewed the appropriate phone numbers to call if they have any and questions or concerns.

## 2023-05-21 NOTE — H&P (Signed)
 REFERRING PHYSICIAN: Georgann Housekeeper, MD  PROVIDER: Jaelen Soth Myra Rude, MD   Chief Complaint: New Consultation (CYST ON BACK)  History of Present Illness:  Patient is referred by Dr. Georgann Housekeeper for surgical evaluation and management of epidermal cyst involving the right shoulder and mid upper back. Patient gives a history of having an infected cyst incised and drained approximately 1 year ago. Patient has had a recurrence in the past few months. She now has a enlarging uncomfortable cyst on the posterior right shoulder and a smaller draining cyst in the midline of the upper back. She presents today to discuss surgical management.  Review of Systems: A complete review of systems was obtained from the patient. I have reviewed this information and discussed as appropriate with the patient. See HPI as well for other ROS.  Review of Systems  Constitutional: Negative.  HENT: Negative.  Eyes: Negative.  Respiratory: Negative.  Cardiovascular: Negative.  Gastrointestinal: Negative.  Genitourinary: Negative.  Musculoskeletal: Negative.  Skin:  Two areas on back with drainage  Neurological: Negative.  Endo/Heme/Allergies: Negative.  Psychiatric/Behavioral: Negative.    Medical History: Past Medical History:  Diagnosis Date  Anemia  Anxiety  Hypertension  Sleep apnea   Patient Active Problem List  Diagnosis  Epidermal cyst   Past Surgical History:  Procedure Laterality Date  HYSTERECTOMY    Allergies  Allergen Reactions  Lyrica [Pregabalin] Hives, Rash and Other (See Comments)  Drowsiness   Current Outpatient Medications on File Prior to Visit  Medication Sig Dispense Refill  acetaZOLAMIDE (DIAMOX) 250 MG tablet Take 250 mg by mouth 2 (two) times daily  amitriptyline (ELAVIL) 25 MG tablet Take 50 mg by mouth at bedtime  amLODIPine (NORVASC) 10 MG tablet Take 1 tablet by mouth every 12 (twelve) hours  lisinopriL (ZESTRIL) 10 MG tablet 1 tablet Orally Once a day    No current facility-administered medications on file prior to visit.   Family History  Problem Relation Age of Onset  Stroke Mother  High blood pressure (Hypertension) Mother  High blood pressure (Hypertension) Father  Obesity Sister  High blood pressure (Hypertension) Sister  Deep vein thrombosis (DVT or abnormal blood clot formation) Sister  Diabetes Sister  High blood pressure (Hypertension) Brother  Diabetes Maternal Grandmother  High blood pressure (Hypertension) Maternal Grandmother  Stroke Maternal Grandmother  Colon cancer Paternal Grandfather    Social History   Tobacco Use  Smoking Status Unknown  Smokeless Tobacco Not on file    Social History   Socioeconomic History  Marital status: Married  Tobacco Use  Smoking status: Unknown  Vaping Use  Vaping status: Unknown  Substance and Sexual Activity  Drug use: Never   Social Drivers of Health   Food Insecurity: Food Insecurity Present (02/09/2023)  Received from [facility]  Hunger Vital Sign  Worried About Running Out of Food in the Last Year: Sometimes true  Ran Out of Food in the Last Year: Never true  Transportation Needs: No Transportation Needs (02/09/2023)  Received from [facility] - Transportation  Lack of Transportation (Medical): No  Lack of Transportation (Non-Medical): No   Objective:   Vitals:  BP: (!) 154/96  Pulse: 64  Temp: 36.6 C (97.9 F)  Weight: (!) 107.3 kg (236 lb 9.6 oz)  Height: 175.3 cm (5\' 9" )  PainSc: 0-No pain   Body mass index is 34.94 kg/m.  Physical Exam   GENERAL APPEARANCE Comfortable, no acute issues Development: normal Gross deformities: none  SKIN Rash, lesions,  ulcers: none Induration, erythema: On the posterior right shoulder is an ulcerated lesion about 5 mm in diameter with an underlying firm subcutaneous mass measuring approximately 3 cm in diameter. This is consistent with a moderately large epidermal cyst. Just to the left of the  midline in the back is a small ulcerated area measuring approximately 2 mm in diameter with a small amount of whitish drainage with manipulation. There is no sign of active infection at either site.  EYES Conjunctiva and lids: normal Pupils: equal  EARS, NOSE, MOUTH, THROAT External ears: no lesion or deformity External nose: no lesion or deformity Hearing: grossly normal  CHEST/CV Not assessed  ABDOMEN Not assessed  GENITOURINARY/RECTAL Not assessed  MUSCULOSKELETAL Station and gait: normal Digits and nails: no clubbing or cyanosis Muscle strength: grossly normal all extremities Deformity: none  LYMPHATIC Cervical: none palpable Supraclavicular: none palpable  PSYCHIATRIC Oriented to person, place, and time: yes Mood and affect: normal for situation Judgment and insight: appropriate for situation   Assessment and Plan:   Epidermal cyst  Patient presents today on referral from her primary care physician for surgical evaluation and management of two epidermal cysts on the back with a history of infection requiring incision and drainage. At this point there is drainage from the central back cyst and there is reaccumulation and the cyst on the posterior right shoulder.  I have recommended surgical excision of both lesions under sedation and local anesthesia as an outpatient surgical procedure. We discussed the size and location of the surgical incisions. We discussed the possibility of infection. We discussed the postoperative course to be anticipated. The patient understands and wishes to proceed with surgery in the near future.   Darnell Level, MD [facility] A [facility] practice Office: [phone]

## 2023-05-23 ENCOUNTER — Encounter (HOSPITAL_COMMUNITY)
Admission: RE | Admit: 2023-05-23 | Discharge: 2023-05-23 | Disposition: A | Discharge status: Home / Self Care | Payer: 59 | Source: Ambulatory Visit | Attending: Surgery | Admitting: Surgery

## 2023-05-23 ENCOUNTER — Encounter (HOSPITAL_COMMUNITY): Payer: Self-pay

## 2023-05-23 VITALS — Ht 69.0 in | Wt 236.0 lb

## 2023-05-23 DIAGNOSIS — Z01818 Encounter for other preprocedural examination: Secondary | ICD-10-CM

## 2023-05-23 DIAGNOSIS — I10 Essential (primary) hypertension: Secondary | ICD-10-CM

## 2023-05-26 ENCOUNTER — Encounter (HOSPITAL_COMMUNITY)
Admission: RE | Disposition: A | Discharge status: Home / Self Care | Payer: Self-pay | Source: Home / Self Care | Attending: Surgery

## 2023-05-26 ENCOUNTER — Ambulatory Visit (HOSPITAL_BASED_OUTPATIENT_CLINIC_OR_DEPARTMENT_OTHER): Payer: Self-pay | Admitting: Registered Nurse

## 2023-05-26 ENCOUNTER — Encounter (HOSPITAL_COMMUNITY): Payer: Self-pay | Admitting: Surgery

## 2023-05-26 ENCOUNTER — Ambulatory Visit (HOSPITAL_COMMUNITY)
Admission: RE | Admit: 2023-05-26 | Discharge: 2023-05-26 | Disposition: A | Discharge status: Home / Self Care | Payer: Medicaid Other | Attending: Surgery | Admitting: Surgery

## 2023-05-26 ENCOUNTER — Other Ambulatory Visit: Payer: Self-pay

## 2023-05-26 ENCOUNTER — Ambulatory Visit (HOSPITAL_COMMUNITY): Payer: Self-pay | Admitting: Physician Assistant

## 2023-05-26 DIAGNOSIS — Z01818 Encounter for other preprocedural examination: Secondary | ICD-10-CM

## 2023-05-26 DIAGNOSIS — F32A Depression, unspecified: Secondary | ICD-10-CM | POA: Insufficient documentation

## 2023-05-26 DIAGNOSIS — R519 Headache, unspecified: Secondary | ICD-10-CM | POA: Diagnosis not present

## 2023-05-26 DIAGNOSIS — Z6834 Body mass index (BMI) 34.0-34.9, adult: Secondary | ICD-10-CM | POA: Diagnosis not present

## 2023-05-26 DIAGNOSIS — Z79899 Other long term (current) drug therapy: Secondary | ICD-10-CM | POA: Diagnosis not present

## 2023-05-26 DIAGNOSIS — E669 Obesity, unspecified: Secondary | ICD-10-CM | POA: Diagnosis not present

## 2023-05-26 DIAGNOSIS — F419 Anxiety disorder, unspecified: Secondary | ICD-10-CM | POA: Insufficient documentation

## 2023-05-26 DIAGNOSIS — L720 Epidermal cyst: Secondary | ICD-10-CM | POA: Diagnosis not present

## 2023-05-26 DIAGNOSIS — G932 Benign intracranial hypertension: Secondary | ICD-10-CM | POA: Insufficient documentation

## 2023-05-26 DIAGNOSIS — M797 Fibromyalgia: Secondary | ICD-10-CM | POA: Diagnosis not present

## 2023-05-26 DIAGNOSIS — I10 Essential (primary) hypertension: Secondary | ICD-10-CM

## 2023-05-26 HISTORY — PX: EXCISION OF KELOID: SHX6267

## 2023-05-26 LAB — BASIC METABOLIC PANEL
Anion gap: 7 (ref 5–15)
BUN: 20 mg/dL (ref 6–20)
CO2: 23 mmol/L (ref 22–32)
Calcium: 8.8 mg/dL — ABNORMAL LOW (ref 8.9–10.3)
Chloride: 107 mmol/L (ref 98–111)
Creatinine, Ser: 1.15 mg/dL — ABNORMAL HIGH (ref 0.44–1.00)
GFR, Estimated: 60 mL/min — ABNORMAL LOW (ref >60)
Glucose, Bld: 100 mg/dL — ABNORMAL HIGH (ref 70–99)
Potassium: 3.7 mmol/L (ref 3.5–5.1)
Sodium: 137 mmol/L (ref 135–145)

## 2023-05-26 LAB — CBC
HCT: 35.7 % — ABNORMAL LOW (ref 36.0–46.0)
Hemoglobin: 11.3 g/dL — ABNORMAL LOW (ref 12.0–15.0)
MCH: 30.4 pg (ref 26.0–34.0)
MCHC: 31.7 g/dL (ref 30.0–36.0)
MCV: 96 fL (ref 80.0–100.0)
Platelets: 259 10*3/uL (ref 150–400)
RBC: 3.72 MIL/uL — ABNORMAL LOW (ref 3.87–5.11)
RDW: 12.7 % (ref 11.5–15.5)
WBC: 4.3 10*3/uL (ref 4.0–10.5)
nRBC: 0 % (ref 0.0–0.2)

## 2023-05-26 LAB — POCT PREGNANCY, URINE: Preg Test, Ur: NEGATIVE

## 2023-05-26 SURGERY — EXCISION, KELOID
Anesthesia: Monitor Anesthesia Care | Laterality: Right

## 2023-05-26 MED ORDER — DEXAMETHASONE SODIUM PHOSPHATE 10 MG/ML IJ SOLN
INTRAMUSCULAR | Status: DC | PRN
  Administered 2023-05-26: 5 mg via INTRAVENOUS

## 2023-05-26 MED ORDER — ACETAMINOPHEN 500 MG PO TABS
1000.0000 mg | ORAL_TABLET | Freq: Once | ORAL | Status: AC
  Administered 2023-05-26: 1000 mg via ORAL
  Filled 2023-05-26: qty 2

## 2023-05-26 MED ORDER — ORAL CARE MOUTH RINSE: 15.0000 mL | Freq: Once | OROMUCOSAL | Status: AC

## 2023-05-26 MED ORDER — CEFAZOLIN SODIUM-DEXTROSE 2-4 GM/100ML-% IV SOLN
2.0000 g | INTRAVENOUS | Status: AC
Start: 2023-05-26 — End: 2023-05-26
  Administered 2023-05-26: 2 g via INTRAVENOUS
  Filled 2023-05-26: qty 100

## 2023-05-26 MED ORDER — LACTATED RINGERS IV SOLN
INTRAVENOUS | Status: DC
Start: 2023-05-26 — End: 2023-05-26

## 2023-05-26 MED ORDER — BUPIVACAINE-EPINEPHRINE 0.5% -1:200000 IJ SOLN
INTRAMUSCULAR | Status: DC | PRN
  Administered 2023-05-26: 30 mL

## 2023-05-26 MED ORDER — ONDANSETRON HCL 4 MG/2ML IJ SOLN
INTRAMUSCULAR | Status: DC | PRN
  Administered 2023-05-26: 4 mg via INTRAVENOUS

## 2023-05-26 MED ORDER — OXYCODONE HCL 5 MG PO TABS
ORAL_TABLET | ORAL | Status: AC
  Administered 2023-05-26: 5 mg via ORAL
  Filled 2023-05-26: qty 1

## 2023-05-26 MED ORDER — LIDOCAINE HCL (PF) 1 % IJ SOLN
INTRAMUSCULAR | Status: AC
Start: 2023-05-26 — End: ?
  Filled 2023-05-26: qty 30

## 2023-05-26 MED ORDER — 0.9 % SODIUM CHLORIDE (POUR BTL) OPTIME
TOPICAL | Status: DC | PRN
  Administered 2023-05-26: 1000 mL

## 2023-05-26 MED ORDER — BUPIVACAINE-EPINEPHRINE (PF) 0.5% -1:200000 IJ SOLN
INTRAMUSCULAR | Status: AC
Start: 2023-05-26 — End: ?
  Filled 2023-05-26: qty 30

## 2023-05-26 MED ORDER — MIDAZOLAM HCL 2 MG/2ML IJ SOLN
INTRAMUSCULAR | Status: AC
Start: 2023-05-26 — End: ?
  Filled 2023-05-26: qty 2

## 2023-05-26 MED ORDER — CHLORHEXIDINE GLUCONATE 0.12 % MT SOLN
15.0000 mL | Freq: Once | OROMUCOSAL | Status: AC
  Administered 2023-05-26: 15 mL via OROMUCOSAL

## 2023-05-26 MED ORDER — CHLORHEXIDINE GLUCONATE CLOTH 2 % EX PADS: 6.0000 | MEDICATED_PAD | Freq: Once | CUTANEOUS | Status: DC

## 2023-05-26 MED ORDER — CELECOXIB 200 MG PO CAPS
200.0000 mg | ORAL_CAPSULE | Freq: Once | ORAL | Status: AC
  Administered 2023-05-26: 200 mg via ORAL
  Filled 2023-05-26: qty 1

## 2023-05-26 MED ORDER — OXYCODONE HCL 5 MG/5ML PO SOLN: 5.0000 mg | Freq: Once | ORAL | Status: AC | PRN

## 2023-05-26 MED ORDER — AMISULPRIDE (ANTIEMETIC) 5 MG/2ML IV SOLN: 10.0000 mg | Freq: Once | INTRAVENOUS | Status: AC | PRN

## 2023-05-26 MED ORDER — SODIUM CHLORIDE 0.9 % IV SOLN: 12.5000 mg | INTRAVENOUS | Status: DC | PRN

## 2023-05-26 MED ORDER — FENTANYL CITRATE (PF) 100 MCG/2ML IJ SOLN
INTRAMUSCULAR | Status: AC
Start: 2023-05-26 — End: ?
  Filled 2023-05-26: qty 2

## 2023-05-26 MED ORDER — OXYCODONE HCL 5 MG PO TABS: 5.0000 mg | ORAL_TABLET | Freq: Once | ORAL | Status: AC | PRN

## 2023-05-26 MED ORDER — FENTANYL CITRATE PF 50 MCG/ML IJ SOSY
25.0000 ug | PREFILLED_SYRINGE | INTRAMUSCULAR | Status: DC | PRN
  Administered 2023-05-26: 50 ug via INTRAVENOUS

## 2023-05-26 MED ORDER — FENTANYL CITRATE PF 50 MCG/ML IJ SOSY
PREFILLED_SYRINGE | INTRAMUSCULAR | Status: AC
  Administered 2023-05-26: 50 ug via INTRAVENOUS
  Filled 2023-05-26: qty 2

## 2023-05-26 MED ORDER — FENTANYL CITRATE PF 50 MCG/ML IJ SOSY
PREFILLED_SYRINGE | INTRAMUSCULAR | Status: AC
  Administered 2023-05-26: 50 ug via INTRAVENOUS
  Filled 2023-05-26: qty 1

## 2023-05-26 MED ORDER — TRAMADOL HCL 50 MG PO TABS: 50.0000 mg | ORAL_TABLET | Freq: Four times a day (QID) | ORAL | 0 refills | Status: AC | PRN

## 2023-05-26 MED ORDER — LIDOCAINE HCL (PF) 2 % IJ SOLN
INTRAMUSCULAR | Status: DC | PRN
  Administered 2023-05-26: 60 mg via INTRADERMAL

## 2023-05-26 MED ORDER — FENTANYL CITRATE (PF) 100 MCG/2ML IJ SOLN
INTRAMUSCULAR | Status: DC | PRN
  Administered 2023-05-26 (×2): 50 ug via INTRAVENOUS

## 2023-05-26 MED ORDER — AMISULPRIDE (ANTIEMETIC) 5 MG/2ML IV SOLN
INTRAVENOUS | Status: AC
  Administered 2023-05-26: 10 mg via INTRAVENOUS
  Filled 2023-05-26: qty 4

## 2023-05-26 MED ORDER — MIDAZOLAM HCL 2 MG/2ML IJ SOLN
INTRAMUSCULAR | Status: DC | PRN
  Administered 2023-05-26: 2 mg via INTRAVENOUS

## 2023-05-26 MED ORDER — PROPOFOL 10 MG/ML IV BOLUS
INTRAVENOUS | Status: DC | PRN
  Administered 2023-05-26: 40 mg via INTRAVENOUS
  Administered 2023-05-26: 10 mg via INTRAVENOUS
  Administered 2023-05-26: 100 ug/kg/min via INTRAVENOUS

## 2023-05-26 SURGICAL SUPPLY — 30 items
BAG COUNTER SPONGE SURGICOUNT (BAG) IMPLANT
CHLORAPREP W/TINT 26 (MISCELLANEOUS) ×1 IMPLANT
COVER SURGICAL LIGHT HANDLE (MISCELLANEOUS) ×1 IMPLANT
DERMABOND ADVANCED .7 DNX12 (GAUZE/BANDAGES/DRESSINGS) IMPLANT
DRAPE LAPAROSCOPIC ABDOMINAL (DRAPES) IMPLANT
DRAPE LAPAROTOMY T 102X78X121 (DRAPES) IMPLANT
DRAPE LAPAROTOMY TRNSV 102X78 (DRAPES) IMPLANT
DRAPE SHEET LG 3/4 BI-LAMINATE (DRAPES) IMPLANT
ELECT REM PT RETURN 15FT ADLT (MISCELLANEOUS) ×1 IMPLANT
GAUZE SPONGE 4X4 12PLY STRL (GAUZE/BANDAGES/DRESSINGS) IMPLANT
GLOVE BIOGEL PI IND STRL 7.0 (GLOVE) ×1 IMPLANT
GLOVE SURG ORTHO 8.0 STRL STRW (GLOVE) ×1 IMPLANT
GLOVE SURG SYN 7.5 E (GLOVE) ×1 IMPLANT
GLOVE SURG SYN 7.5 PF PI (GLOVE) ×2 IMPLANT
GOWN STRL REUS W/ TWL XL LVL3 (GOWN DISPOSABLE) ×2 IMPLANT
KIT BASIN OR (CUSTOM PROCEDURE TRAY) ×2 IMPLANT
KIT TURNOVER KIT A (KITS) IMPLANT
MARKER SKIN DUAL TIP RULER LAB (MISCELLANEOUS) IMPLANT
NDL HYPO 25X1 1.5 SAFETY (NEEDLE) ×2 IMPLANT
NEEDLE HYPO 25X1 1.5 SAFETY (NEEDLE) ×1 IMPLANT
NS IRRIG 1000ML POUR BTL (IV SOLUTION) ×1 IMPLANT
PACK GENERAL/GYN (CUSTOM PROCEDURE TRAY) ×2 IMPLANT
SPIKE FLUID TRANSFER (MISCELLANEOUS) IMPLANT
SPONGE T-LAP 4X18 ~~LOC~~+RFID (SPONGE) IMPLANT
STAPLER SKIN PROX WIDE 3.9 (STAPLE) IMPLANT
STRIP CLOSURE SKIN 1/2X4 (GAUZE/BANDAGES/DRESSINGS) IMPLANT
SUT MNCRL AB 4-0 PS2 18 (SUTURE) IMPLANT
SUT VIC AB 3-0 SH 18 (SUTURE) IMPLANT
SYR CONTROL 10ML LL (SYRINGE) ×2 IMPLANT
TOWEL OR 17X26 10 PK STRL BLUE (TOWEL DISPOSABLE) ×2 IMPLANT

## 2023-05-26 NOTE — Anesthesia Preprocedure Evaluation (Addendum)
 Anesthesia Evaluation  Patient identified by MRN, date of birth, ID band Patient awake    Reviewed: Allergy & Precautions, NPO status , Patient's Chart, lab work & pertinent test results  History of Anesthesia Complications Negative for: history of anesthetic complications  Airway Mallampati: III  TM Distance: >3 FB Neck ROM: Full    Dental  (+) Dental Advisory Given, Teeth Intact   Pulmonary   Probable OSA    Pulmonary exam normal        Cardiovascular hypertension, Pt. on medications and Pt. on home beta blockers Normal cardiovascular exam     Neuro/Psych  Headaches PSYCHIATRIC DISORDERS Anxiety Depression     Idiopathic intracranial HTN     GI/Hepatic negative GI ROS, Neg liver ROS,,,  Endo/Other   Obesity   Renal/GU negative Renal ROS     Musculoskeletal  (+)  Fibromyalgia -  Abdominal   Peds  Hematology negative hematology ROS (+)   Anesthesia Other Findings Left adrenal mass with negative metanephrines   Reproductive/Obstetrics                             Anesthesia Physical Anesthesia Plan  ASA: 2  Anesthesia Plan: MAC   Post-op Pain Management: Tylenol PO (pre-op)* and Celebrex PO (pre-op)*   Induction:   PONV Risk Score and Plan: 2 and Propofol infusion and Treatment may vary due to age or medical condition  Airway Management Planned: Natural Airway and Simple Face Mask  Additional Equipment: None  Intra-op Plan:   Post-operative Plan:   Informed Consent: I have reviewed the patients History and Physical, chart, labs and discussed the procedure including the risks, benefits and alternatives for the proposed anesthesia with the patient or authorized representative who has indicated his/her understanding and acceptance.       Plan Discussed with: CRNA and Anesthesiologist  Anesthesia Plan Comments:         Anesthesia Quick Evaluation

## 2023-05-26 NOTE — Anesthesia Postprocedure Evaluation (Signed)
 Anesthesia Post Note  Patient: Julie Schwartz  Procedure(s) Performed: EXCISION OF EPIDERMAL CYSTS 2 FROM UPPER MID BACK AND RIGHT SHOULDER (Right)     Patient location during evaluation: PACU Anesthesia Type: MAC Level of consciousness: awake and alert Pain management: pain level controlled Vital Signs Assessment: post-procedure vital signs reviewed and stable Respiratory status: spontaneous breathing, nonlabored ventilation and respiratory function stable Cardiovascular status: stable and blood pressure returned to baseline Anesthetic complications: no   There were no known notable events for this encounter.  Last Vitals:  Vitals:   05/26/23 1215 05/26/23 1227  BP: (!) 155/94 (!) 156/91  Pulse: (!) 55 (!) 49  Resp: 18   Temp:  (!) 36.4 C  SpO2: 100% 100%    Last Pain:  Vitals:   05/26/23 1227  TempSrc:   PainSc: 0-No pain                 Beryle Lathe

## 2023-05-26 NOTE — Discharge Instructions (Addendum)
  [facility] -- DISCHARGE INSTRUCTIONS  REMINDER:   Carry a list of your medications and allergies with you at all times  Call your pharmacy at least 1 week in advance to refill prescriptions  Do not mix any prescribed pain medicine with alcohol  Do not drive any motor vehicles while taking pain medication  Take medications with food unless otherwise directed  Follow-up appointments (date to return to physician): Please call [phone] to confirm your follow up appointment with your surgeon.  Call your Surgeon if you have:  Temperature greater than 101.0  Persistent nausea and vomiting  Severe uncontrolled pain  Redness, tenderness, or signs of infection (pain, swelling, redness, odor or green/yellow discharge around the site)  Difficulty breathing, headache or visual disturbances  Hives  Persistent dizziness or light-headedness  Any other questions or concerns you may have after discharge  In an emergency, call 911 or go to an Emergency Department at a nearby hospital.  Diet: Begin with liquids, and if they are tolerated, resume your usual diet.  Avoid spicy, greasy or heavy foods.  If you have nausea or vomiting, go back to liquids.  If you cannot keep liquids down, call your doctor.  Avoid alcohol consumption while on prescription pain medications. Good nutrition promotes healing. Increase fiber and fluids.   ADDITIONAL INSTRUCTIONS: Leave Dermabond in place for 7 to 10 days.  May shower immediately.  May use ice pack as needed for discomfort.  Prescription is placed at your pharmacy for pain medication if needed.  May also use Tylenol and ibuprofen as needed.  [facility] Office: [phone]

## 2023-05-26 NOTE — Anesthesia Procedure Notes (Signed)
 Procedure Name: MAC Date/Time: 05/26/2023 10:38 AM  Performed by: Vanessa Moody, CRNAPre-anesthesia Checklist: Patient identified, Emergency Drugs available, Suction available and Patient being monitored Patient Re-evaluated:Patient Re-evaluated prior to induction Oxygen Delivery Method: Nasal cannula

## 2023-05-26 NOTE — Transfer of Care (Signed)
 Immediate Anesthesia Transfer of Care Note  Patient: Julie Schwartz  Procedure(s) Performed: EXCISION OF EPIDERMAL CYSTS 2 FROM UPPER MID BACK AND RIGHT SHOULDER (Right)  Patient Location: PACU  Anesthesia Type:MAC  Level of Consciousness: awake, alert , and oriented  Airway & Oxygen Therapy: Patient Spontanous Breathing  Post-op Assessment: Report given to RN, Post -op Vital signs reviewed and stable, and Patient moving all extremities X 4  Post vital signs: Reviewed and stable  Last Vitals:  Vitals Value Taken Time  BP    Temp    Pulse    Resp    SpO2      Last Pain:  Vitals:   05/26/23 0920  TempSrc:   PainSc: 0-No pain         Complications: There were no known notable events for this encounter.

## 2023-05-26 NOTE — Interval H&P Note (Signed)
 History and Physical Interval Note:  05/26/2023 10:02 AM  Julie Schwartz  has presented today for surgery, with the diagnosis of EPIDERMAL CYSTS.  The various methods of treatment have been discussed with the patient and family. After consideration of risks, benefits and other options for treatment, the patient has consented to    Procedure(s) with comments: EXCISION OF EPIDERMAL CYSTS 2 FROM UPPER MID BACK AND RIGHT SHOULDER (Right) - MAC AND LOCAL as a surgical intervention.    The patient's history has been reviewed, patient examined, no change in status, stable for surgery.  I have reviewed the patient's chart and labs.  Questions were answered to the patient's satisfaction.    Darnell Level, MD [facility] practice Office: [phone]   Darnell Level

## 2023-05-26 NOTE — Op Note (Signed)
 Operative Note  Pre-operative Diagnosis:  epidermal cysts with history of abscess  Post-operative Diagnosis:  same  Surgeon:  Darnell Level, MD  Assistant:  none   Procedure:  1. Excision epidermal cyst right posterior shoulder, 2.5 x 1.5 x 1.5 cm, subcutaneous;  2. Excision epidermal cyst left upper back, 2.5 x 1.5 x 1.0 cm, subcutaneous  Anesthesia:  local with IV sedation (MAC)  Estimated Blood Loss:  minimal  Drains: none         Specimen: to pathology  Indications:  Patient is referred by Dr. Georgann Housekeeper for surgical evaluation and management of epidermal cyst involving the right shoulder and mid upper back. Patient gives a history of having an infected cyst incised and drained approximately 1 year ago. Patient has had a recurrence in the past few months. She now has a enlarging uncomfortable cyst on the posterior right shoulder and a smaller draining cyst in the midline of the upper back. She presents today to discuss surgical management.   Procedure:  The patient was seen in the pre-op holding area. The risks, benefits, complications, treatment options, and expected outcomes were previously discussed with the patient. The patient agreed with the proposed plan and has signed the informed consent form.  The patient was brought to the operating room by the surgical team, identified as Julie Schwartz and the procedure verified. A "time out" was completed and the above information confirmed.  Following administration of intravenous sedation, the patient is turned to a left lateral decubitus position and then prepped and draped in the usual aseptic fashion.  After ascertaining that an adequate level sedation had been achieved, the skin around the lesion on the posterior right shoulder is anesthetized with local anesthetic with epinephrine.  This is infiltrated deeply into the subcutaneous tissues.  Using a #15 blade an elliptical incision is made so as to encompass the sinus tract from  the underlying cystic lesion.  Dissection is carried sharply into the subcutaneous tissues.  The entire cyst is excised using the electrocautery for hemostasis.  The cyst measures 2.5 x 1.5 x 1.5 cm.  It is submitted to pathology for review.  Hemostasis is achieved with the electrocautery.  Subcutaneous tissues are closed with interrupted 3-0 Vicryl sutures.  Skin is closed with a running 4-0 Monocryl subcuticular suture.  Wound was washed and dried and Dermabond is applied as dressing.  Next we turned our attention to the lesion in the left upper back.  This is also anesthetized with local anesthetic with epinephrine both at the level of the skin and into the deeper subcutaneous tissues.  Using a #15 blade an elliptical incision is made so as to encompass the sinus tract to the skin.  Dissection is carried sharply into the subcutaneous tissues such that the entire cyst is excised.  It measures 2.5 x 1.5 x 1.0 cm and is submitted in its entirety to pathology for review.  Hemostasis is achieved with the electrocautery.  Subcutaneous tissues are closed with interrupted 3-0 Vicryl sutures.  Skin is closed with a running 4-0 Monocryl subcuticular suture.  Wound was washed and dried and Dermabond is applied as dressing.  Patient is awakened from sedation and transferred to the recovery room.  The patient tolerated the procedure well.   Darnell Level, MD [facility] Office: [phone]

## 2023-05-27 ENCOUNTER — Encounter (HOSPITAL_COMMUNITY): Payer: Self-pay | Admitting: Surgery

## 2023-05-29 ENCOUNTER — Encounter: Payer: Self-pay | Admitting: Neurology

## 2023-05-29 ENCOUNTER — Ambulatory Visit (INDEPENDENT_AMBULATORY_CARE_PROVIDER_SITE_OTHER): Admitting: Neurology

## 2023-05-29 VITALS — BP 151/79 | HR 76 | Ht 69.0 in | Wt 232.0 lb

## 2023-05-29 DIAGNOSIS — G932 Benign intracranial hypertension: Secondary | ICD-10-CM | POA: Diagnosis not present

## 2023-05-29 DIAGNOSIS — M542 Cervicalgia: Secondary | ICD-10-CM | POA: Diagnosis not present

## 2023-05-29 DIAGNOSIS — R519 Headache, unspecified: Secondary | ICD-10-CM | POA: Diagnosis not present

## 2023-05-29 LAB — SURGICAL PATHOLOGY

## 2023-05-29 MED ORDER — TOPIRAMATE 25 MG PO TABS: 25.0000 mg | ORAL_TABLET | Freq: Every day | ORAL | 5 refills | Status: DC

## 2023-05-29 NOTE — Progress Notes (Signed)
 Follow-up Visit   Date: 05/29/2023    MARRANDA ARAKELIAN MRN: [id number] DOB: 06-22-1975    Julie Schwartz is a 48 y.o. right-handed female with hypertension, right CTS release, pseudotumor cerebri (2014), pheochromocytoma, and fibromyalgia returning to the clinic for follow-up of headaches.  The patient was accompanied to the clinic by self.    IMPRESSION/PLAN: Chronic daily headaches, worse. - She takes propranolol 160mg  daily and lisinopril 10mg  daily for blood pressure  - Start topiramate 25mg  daily  - Start neck PT  2.   History of idiopathic intracranial hypertension (IIH) with LP on 10/28/2022 showing normal opening pressure (18 cm H2O).  Continue diamox 250mg  twice daily.  Return to clinic in 4 months  --------------------------------------------- History of present illness: Starting around April 2024, she began having pain involving the bifrontal and base of the neck, as if she was wearing a visor.  Pain is described as dull, pressure, tight pain. It can last anywhere from several hours to all days.  Headaches occur almost daily for the past month.  She endorses photophobia, phonophobia, nausea.  She reports having headaches in the past which were holocephalic.  She has tried tylenol, NSAIDs which does not provide relief.     She has blurred vision and had eye exam which was normal three weeks ago. Specifically, no papilledema.  Because of light sensitivity, she was recommended rose tine eye glasses.  Of note, she has history of idiopathic intracranial hypertension which was diagnosed by LP in 2014 where opening pressure was 26.5.   UPDATE 10/11/2022:  She continues to have daily headaches, which are unchanged.  Pain remains bifrontal and at the base of the neck, described as pressure.  She is compliant with diamox 250mg  BID and feels that it helps some, but she is getting tingling in the face.  She went to the ER on 7/16 for headaches and was offered headache cocktail,  but did not want to get any medications. CT head was stable and continued to show partially empty sella. No new findings.  No new complaints.     UPDATE 11/28/2022:  Due to ongoing headaches and concern that all of her headache was not coming from pseudotumor cerebri, she underwent LP on 10/28/2022 which showed OP of 18cm (normal).  She reports having ongoing headaches and neck pain.  Her BP is staying elevated and she can tell that this makes her headaches worse.    UPDATE 05/29/2023:  She is here for follow-up visit due to worsening headaches.  She continues to have daily headaches and is taking OTC medications every 6 hours, until a few days ago.  She denies any vision changes.  Pain is bifrontal  and associated with nausea, photophobia, and phonophobia. She is frustrated by her headaches.   Medications:  Current Outpatient Medications on File Prior to Visit  Medication Sig Dispense Refill   acetaminophen (TYLENOL) 500 MG tablet Take 1,000 mg by mouth every 6 (six) hours as needed for moderate pain (pain score 4-6).     acetaZOLAMIDE (DIAMOX) 250 MG tablet Take 250 mg by mouth 2 (two) times daily.     amLODipine (NORVASC) 10 MG tablet Take 10 mg by mouth daily.     doxazosin (CARDURA) 1 MG tablet Take 1 tablet by mouth 2 (two) times daily.     gabapentin (NEURONTIN) 300 MG capsule Take 300 mg by mouth 5 (five) times daily as needed (pain).     lisinopril (ZESTRIL) 10 MG tablet Take  1 tablet by mouth daily.     Multiple Vitamin (MULTIVITAMIN WITH MINERALS) TABS tablet Take 1 tablet by mouth daily.     propranolol ER (INDERAL LA) 160 MG SR capsule Take 160 mg by mouth at bedtime.     traMADol (ULTRAM) 50 MG tablet Take 1 tablet (50 mg total) by mouth every 6 (six) hours as needed for moderate pain (pain score 4-6). 12 tablet 0   No current facility-administered medications on file prior to visit.    Allergies:  Allergies  Allergen Reactions   Lyrica [Pregabalin] Other (See Comments)    drowsy     Vital Signs:  BP (!) 151/79   Pulse 76   Ht 5\' 9"  (1.753 m)   Wt 232 lb (105.2 kg)   LMP 01/20/2023 (Approximate)   SpO2 100%   BMI 34.26 kg/m   Neurological Exam: MENTAL STATUS including orientation to time, place, person, recent and remote memory, attention span and concentration, language, and fund of knowledge is normal.  Speech is not dysarthric.  CRANIAL NERVES:  Pupils equal round and reactive to light.  Normal conjugate, extra-ocular eye movements in all directions of gaze.  No ptosis.  Face is symmetric.   MOTOR:  Motor strength is 5/5 in all extremities.    MSRs:  Reflexes are 2+/4 throughout.  COORDINATION/GAIT:  Gait narrow based and stable.   Data: CT head wo contrast 10/04/2022: 1. No evidence of an acute intracranial abnormality. 2. Partially empty sella turcica. This finding can reflect incidental anatomic variation, or alternatively, it can be associated with idiopathic intracranial hypertension (pseudotumor cerebri).  LP 10/28/2022:  OP 18cm H2O  Thank you for allowing me to participate in patient's care.  If I can answer any additional questions, I would be pleased to do so.    Sincerely,    Andrey Mccaskill K. Allena Katz, DO

## 2023-05-29 NOTE — Patient Instructions (Signed)
 Limit all over the counter medications to twice per week  Start topiramate 25mg  daily  Start neck physiotherapy

## 2023-07-30 ENCOUNTER — Emergency Department (HOSPITAL_BASED_OUTPATIENT_CLINIC_OR_DEPARTMENT_OTHER)
Admission: EM | Admit: 2023-07-30 | Discharge: 2023-07-30 | Disposition: A | Discharge status: Home / Self Care | Attending: Emergency Medicine | Admitting: Emergency Medicine

## 2023-07-30 ENCOUNTER — Other Ambulatory Visit: Payer: Self-pay

## 2023-07-30 ENCOUNTER — Encounter (HOSPITAL_BASED_OUTPATIENT_CLINIC_OR_DEPARTMENT_OTHER): Payer: Self-pay | Admitting: Emergency Medicine

## 2023-07-30 DIAGNOSIS — L03116 Cellulitis of left lower limb: Secondary | ICD-10-CM | POA: Diagnosis not present

## 2023-07-30 DIAGNOSIS — R2242 Localized swelling, mass and lump, left lower limb: Secondary | ICD-10-CM | POA: Diagnosis present

## 2023-07-30 MED ORDER — FLUCONAZOLE 150 MG PO TABS: 150.0000 mg | ORAL_TABLET | Freq: Every day | ORAL | 0 refills | Status: AC

## 2023-07-30 MED ORDER — DOXYCYCLINE HYCLATE 100 MG PO CAPS: 100.0000 mg | ORAL_CAPSULE | Freq: Once | ORAL | 0 refills | Status: AC

## 2023-07-30 MED ORDER — DOXYCYCLINE HYCLATE 100 MG PO TABS
100.0000 mg | ORAL_TABLET | Freq: Once | ORAL | Status: AC
  Administered 2023-07-30: 100 mg via ORAL
  Filled 2023-07-30: qty 1

## 2023-07-30 NOTE — Discharge Instructions (Addendum)
 You have been prescribed an antibiotic called doxycycline . Take this antibiotic 2 times a day for the next 7 days. Take the full course of your antibiotic even if you start feeling better. Antibiotics may cause you to have diarrhea.  Please keep the area clean with soap and water  You have been prescribed Diflucan to take if you develop a yeast infection.  You may use up to 600mg  ibuprofen  every 6 hours as needed for pain.  Do not exceed 2.4g of ibuprofen  per day.  Please follow-up with your PCP if symptoms not improved by the end of the antibiotic course.  Return to the ER for any spreading redness, red streaking, fevers, any other new or concerning symptoms.

## 2023-07-30 NOTE — ED Triage Notes (Signed)
 Left leg  Probable sting/bite, redness and swelling, some drainage.  Notice on Tuesday, getting worse

## 2023-07-30 NOTE — ED Provider Notes (Signed)
 [place] EMERGENCY DEPARTMENT AT [facility] Provider Note   CSN: [id number] Arrival date & time: 07/30/23  4098     History  Chief Complaint  Patient presents with   Insect Bite    Julie Schwartz is a 48 y.o. female with no significant past medical history presents with concern for a bump to her left upper leg that she first noticed about a week ago.  She felt like this was a mosquito bite since it was raised and it occurred after being outside.  It then started to get red and drained some fluid today. Denies any fever or chills at home.   HPI     Home Medications Prior to Admission medications   Medication Sig Start Date End Date Taking? Authorizing Provider  doxycycline  (VIBRAMYCIN ) 100 MG capsule Take 1 capsule (100 mg total) by mouth once for 1 dose. If you develop a yeast infection 07/31/23 07/31/23 Yes Rexie Catena, PA-C  acetaminophen  (TYLENOL ) 500 MG tablet Take 1,000 mg by mouth every 6 (six) hours as needed for moderate pain (pain score 4-6).    [provider]  acetaZOLAMIDE  (DIAMOX ) 250 MG tablet Take 250 mg by mouth 2 (two) times daily.    [provider]  amLODipine (NORVASC) 10 MG tablet Take 10 mg by mouth daily. 07/18/22   [provider]  doxazosin (CARDURA) 1 MG tablet Take 1 tablet by mouth 2 (two) times daily. 07/22/22   [provider]  gabapentin (NEURONTIN) 300 MG capsule Take 300 mg by mouth 5 (five) times daily as needed (pain).    [provider]  lisinopril (ZESTRIL) 10 MG tablet Take 1 tablet by mouth daily. 06/28/22   [provider]  Multiple Vitamin (MULTIVITAMIN WITH MINERALS) TABS tablet Take 1 tablet by mouth daily.    [provider]  propranolol ER (INDERAL LA) 160 MG SR capsule Take 160 mg by mouth at bedtime. 08/24/22   [provider]  topiramate  (TOPAMAX ) 25 MG tablet Take 1 tablet (25 mg total) by mouth daily. 05/29/23   Patel, Donika K, DO  traMADol  (ULTRAM )  50 MG tablet Take 1 tablet (50 mg total) by mouth every 6 (six) hours as needed for moderate pain (pain score 4-6). 05/26/23   Oralee Billow, MD      Allergies    Lyrica  [pregabalin ]    Review of Systems   Review of Systems  Constitutional:  Negative for chills and fever.  Skin:  Positive for wound.    Physical Exam Updated Vital Signs BP (!) 147/87 (BP Location: Right Arm)   Pulse 90   Temp 98.3 F (36.8 C) (Oral)   Resp 15   LMP 01/20/2023 (Approximate)   SpO2 99%  Physical Exam Vitals and nursing note reviewed.  Constitutional:      Appearance: Normal appearance.  HENT:     Head: Atraumatic.  Cardiovascular:     Rate and Rhythm: Normal rate and regular rhythm.  Pulmonary:     Effort: Pulmonary effort is normal.  Skin:    Comments: Small vesicles on the left upper lateral thigh with surrounding erythema approximately 3cm in diameter. No red streaking.  No areas of fluctuance.  No active drainage.  Neurological:     General: No focal deficit present.     Mental Status: She is alert.  Psychiatric:        Mood and Affect: Mood normal.        Behavior: Behavior normal.  ED Results / Procedures / Treatments   Labs (all labs ordered are listed, but only abnormal results are displayed) Labs Reviewed - No data to display  EKG None  Radiology No results found.  Procedures Procedures    Medications Ordered in ED Medications  doxycycline  (VIBRA -TABS) tablet 100 mg (100 mg Oral Given 07/30/23 1712)    ED Course/ Medical Decision Making/ A&P                                 Medical Decision Making Risk Prescription drug management.     Differential diagnosis includes but is not limited to cellulitis, abscess, folliculitis, insect bite  ED Course:  Upon initial evaluation, patient is well-appearing, stable vitals.  Has some vesicles on the left lateral thigh with surrounding erythema.  No active pus drainage.  No areas of fluctuance, no concern for  abscess at this time. Vitals stable, no concern for systemic infection. Will treat for cellulitis with doxycyline. Patient states she gets frequent yeast infections after taking antibiotics, will prescribe diflucan.     Medications Given: Doxycyline  Impression: Left thigh cellulitis   Disposition:  The patient was discharged home with instructions to take course of doxycycline  as prescribed for her cellulitis.  Diflucan prescribed prophylactically for potential yeast infection.  Follow-up with PCP if symptoms not improved by the end of the antibiotic course. Return precautions given.    This chart was dictated using voice recognition software, Dragon. Despite the best efforts of this provider to proofread and correct errors, errors may still occur which can change documentation meaning.          Final Clinical Impression(s) / ED Diagnoses Final diagnoses:  Cellulitis of left thigh    Rx / DC Orders ED Discharge Orders          Ordered    doxycycline  (VIBRAMYCIN ) 100 MG capsule   Once        07/30/23 1724              Rexie Catena, PA-C 07/30/23 1725    Ninetta Basket, MD 08/01/23 1208

## 2023-07-30 NOTE — ED Notes (Signed)

## 2023-07-31 ENCOUNTER — Telehealth (HOSPITAL_BASED_OUTPATIENT_CLINIC_OR_DEPARTMENT_OTHER): Payer: Self-pay | Admitting: Emergency Medicine

## 2023-07-31 MED ORDER — DOXYCYCLINE HYCLATE 100 MG PO CAPS: 100.0000 mg | ORAL_CAPSULE | Freq: Two times a day (BID) | ORAL | 0 refills | Status: AC

## 2023-07-31 NOTE — Telephone Encounter (Cosign Needed Addendum)
 The quantity of prescription was incorrect. Rx resent.

## 2023-08-21 ENCOUNTER — Ambulatory Visit: Payer: Medicaid Other | Admitting: Neurology

## 2023-09-23 ENCOUNTER — Other Ambulatory Visit: Payer: Self-pay | Admitting: Neurology

## 2023-10-02 ENCOUNTER — Ambulatory Visit: Admitting: Neurology

## 2023-11-22 ENCOUNTER — Other Ambulatory Visit: Payer: Self-pay | Admitting: Neurology

## 2024-01-31 ENCOUNTER — Ambulatory Visit: Admitting: Neurology

## 2024-01-31 ENCOUNTER — Encounter: Payer: Self-pay | Admitting: Neurology

## 2024-01-31 VITALS — BP 145/84 | HR 87 | Ht 69.0 in | Wt 235.0 lb

## 2024-01-31 DIAGNOSIS — G932 Benign intracranial hypertension: Secondary | ICD-10-CM

## 2024-01-31 DIAGNOSIS — R519 Headache, unspecified: Secondary | ICD-10-CM | POA: Diagnosis not present

## 2024-01-31 MED ORDER — TOPIRAMATE 50 MG PO TABS: 50.0000 mg | ORAL_TABLET | Freq: Every day | ORAL | 1 refills | Status: AC

## 2024-01-31 MED ORDER — SUMATRIPTAN SUCCINATE 50 MG PO TABS: 50.0000 mg | ORAL_TABLET | ORAL | 5 refills | Status: AC | PRN

## 2024-01-31 NOTE — Progress Notes (Signed)
 Follow-up Visit   Date: 01/31/2024    Julie Schwartz MRN: [id number] DOB: [birth year]    Julie Schwartz is a 48 y.o. right-handed female with hypertension, right CTS release, pseudotumor cerebri (2014), pheochromocytoma, and fibromyalgia returning to the clinic for follow-up of headaches.  The patient was accompanied to the clinic by self.    IMPRESSION/PLAN: Chronic daily headaches, still getting headaches 3-4 days per week.  She is tolerating topiramate  with only mild intermittent paresthesias - Increase topiramate  to 50mg  daily - For severe migraine, start imitrex 50mg  at headache onset.  Limit to twice per week - She also is on propranolol 160mg  daily and lisinopril 10mg  daily for blood pressure  2.   History of idiopathic intracranial hypertension (IIH) with LP on 10/28/2022 showing normal opening pressure (18 cm H2O).  Continue diamox  250mg  twice daily.  Return to clinic in 4-6 months  --------------------------------------------- History of present illness: Starting around April 2024, she began having pain involving the bifrontal and base of the neck, as if she was wearing a visor.  Pain is described as dull, pressure, tight pain. It can last anywhere from several hours to all days.  Headaches occur almost daily for the past month.  She endorses photophobia, phonophobia, nausea.  She reports having headaches in the past which were holocephalic.  She has tried tylenol , NSAIDs which does not provide relief.     She has blurred vision and had eye exam which was normal three weeks ago. Specifically, no papilledema.  Because of light sensitivity, she was recommended rose tine eye glasses.  Of note, she has history of idiopathic intracranial hypertension which was diagnosed by LP in 2014 where opening pressure was 26.5.   UPDATE 10/11/2022:  She continues to have daily headaches, which are unchanged.  Pain remains bifrontal and at the base of the neck, described as pressure.   She is compliant with diamox  250mg  BID and feels that it helps some, but she is getting tingling in the face.  She went to the ER on 7/16 for headaches and was offered headache cocktail, but did not want to get any medications. CT head was stable and continued to show partially empty sella. No new findings.  No new complaints.     UPDATE 11/28/2022:  Due to ongoing headaches and concern that all of her headache was not coming from pseudotumor cerebri, she underwent LP on 10/28/2022 which showed OP of 18cm (normal).  She reports having ongoing headaches and neck pain.  Her BP is staying elevated and she can tell that this makes her headaches worse.    UPDATE 05/29/2023:  She is here for follow-up visit due to worsening headaches.  She continues to have daily headaches and is taking OTC medications every 6 hours, until a few days ago.  She denies any vision changes.  Pain is bifrontal  and associated with nausea, photophobia, and phonophobia. She is frustrated by her headaches.   UPDATE 01/31/2024:  She is here for follow-up visit.  She is tolerating topiramate  25mg  daliy, but had not noticed that it has changed the frequency of intensity of headaches.  She continues to have headaches 3-4 times per week, when when severe, can last all day.  She is not treating the pain with medications.    Medications:  Current Outpatient Medications on File Prior to Visit  Medication Sig Dispense Refill   acetaminophen  (TYLENOL ) 500 MG tablet Take 1,000 mg by mouth every 6 (six) hours as needed  for moderate pain (pain score 4-6).     acetaZOLAMIDE  (DIAMOX ) 250 MG tablet TAKE 1 TABLET(250 MG) BY MOUTH TWICE DAILY 60 tablet 0   amLODipine (NORVASC) 10 MG tablet Take 10 mg by mouth daily.     doxazosin (CARDURA) 1 MG tablet Take 1 tablet by mouth 2 (two) times daily.     fluconazole  (DIFLUCAN ) 150 MG tablet Take 1 tablet (150 mg total) by mouth daily. If you develop yeast infection 1 tablet 0   gabapentin (NEURONTIN) 300 MG  capsule Take 300 mg by mouth 5 (five) times daily as needed (pain).     lisinopril (ZESTRIL) 10 MG tablet Take 1 tablet by mouth daily.     Multiple Vitamin (MULTIVITAMIN WITH MINERALS) TABS tablet Take 1 tablet by mouth daily.     propranolol ER (INDERAL LA) 160 MG SR capsule Take 160 mg by mouth at bedtime.     traMADol  (ULTRAM ) 50 MG tablet Take 1 tablet (50 mg total) by mouth every 6 (six) hours as needed for moderate pain (pain score 4-6). 12 tablet 0   No current facility-administered medications on file prior to visit.    Allergies:  Allergies  Allergen Reactions   Lyrica  [Pregabalin ] Other (See Comments)    drowsy    Vital Signs:  BP (!) 145/84   Pulse 87   Ht 5' 9 (1.753 m)   Wt 235 lb (106.6 kg)   LMP 01/20/2023 (Approximate)   SpO2 98%   BMI 34.70 kg/m   Neurological Exam: MENTAL STATUS including orientation to time, place, person, recent and remote memory, attention span and concentration, language, and fund of knowledge is normal.  Speech is not dysarthric.  CRANIAL NERVES:  Pupils equal round and reactive to light.  Normal conjugate, extra-ocular eye movements in all directions of gaze.  No ptosis.  Face is symmetric.   MOTOR:  Motor strength is 5/5 in all extremities.    COORDINATION/GAIT:  Gait narrow based and stable.   Data: CT head wo contrast 10/04/2022: 1. No evidence of an acute intracranial abnormality. 2. Partially empty sella turcica. This finding can reflect incidental anatomic variation, or alternatively, it can be associated with idiopathic intracranial hypertension (pseudotumor cerebri).  LP 10/28/2022:  OP 18cm H2O  Thank you for allowing me to participate in patient's care.  If I can answer any additional questions, I would be pleased to do so.    Sincerely,    Dreux Mcgroarty K. Tobie, DO

## 2024-01-31 NOTE — Patient Instructions (Addendum)
 Increase topiramate  to 50mg  daily  For severe migraine, take imitrex 50mg  at headache onset  Send me a MyChart message in 2 months to determine further medication adjustment.   Stay up to date with eye exams.

## 2024-08-05 ENCOUNTER — Ambulatory Visit: Admitting: Neurology
